# Patient Record
Sex: Female | Born: 1990 | Race: White | Hispanic: No | Marital: Married | Smoking: Never smoker
Health system: Southern US, Community
[De-identification: ages and names within clinical notes are randomized; demographics above are authoritative.]

## PROBLEM LIST (undated history)

## (undated) DIAGNOSIS — F32A Depression, unspecified: Secondary | ICD-10-CM

## (undated) DIAGNOSIS — R112 Nausea with vomiting, unspecified: Secondary | ICD-10-CM

## (undated) DIAGNOSIS — D509 Iron deficiency anemia, unspecified: Secondary | ICD-10-CM

## (undated) DIAGNOSIS — R519 Headache, unspecified: Secondary | ICD-10-CM

## (undated) DIAGNOSIS — E669 Obesity, unspecified: Secondary | ICD-10-CM

## (undated) DIAGNOSIS — E559 Vitamin D deficiency, unspecified: Secondary | ICD-10-CM

## (undated) DIAGNOSIS — M549 Dorsalgia, unspecified: Secondary | ICD-10-CM

## (undated) DIAGNOSIS — L309 Dermatitis, unspecified: Secondary | ICD-10-CM

## (undated) DIAGNOSIS — E611 Iron deficiency: Secondary | ICD-10-CM

## (undated) DIAGNOSIS — Z9889 Other specified postprocedural states: Secondary | ICD-10-CM

## (undated) DIAGNOSIS — F419 Anxiety disorder, unspecified: Secondary | ICD-10-CM

## (undated) DIAGNOSIS — F4310 Post-traumatic stress disorder, unspecified: Secondary | ICD-10-CM

## (undated) DIAGNOSIS — N289 Disorder of kidney and ureter, unspecified: Secondary | ICD-10-CM

## (undated) DIAGNOSIS — K829 Disease of gallbladder, unspecified: Secondary | ICD-10-CM

## (undated) DIAGNOSIS — F988 Other specified behavioral and emotional disorders with onset usually occurring in childhood and adolescence: Secondary | ICD-10-CM

## (undated) DIAGNOSIS — R7303 Prediabetes: Secondary | ICD-10-CM

## (undated) DIAGNOSIS — M7989 Other specified soft tissue disorders: Secondary | ICD-10-CM

## (undated) DIAGNOSIS — Z3490 Encounter for supervision of normal pregnancy, unspecified, unspecified trimester: Secondary | ICD-10-CM

## (undated) DIAGNOSIS — K760 Fatty (change of) liver, not elsewhere classified: Secondary | ICD-10-CM

## (undated) DIAGNOSIS — K589 Irritable bowel syndrome without diarrhea: Secondary | ICD-10-CM

## (undated) DIAGNOSIS — N920 Excessive and frequent menstruation with regular cycle: Secondary | ICD-10-CM

## (undated) HISTORY — PX: WISDOM TOOTH EXTRACTION: SHX21

## (undated) HISTORY — DX: Fatty (change of) liver, not elsewhere classified: K76.0

## (undated) HISTORY — PX: TYMPANOSTOMY TUBE PLACEMENT: SHX32

## (undated) HISTORY — DX: Irritable bowel syndrome, unspecified: K58.9

## (undated) HISTORY — DX: Depression, unspecified: F32.A

## (undated) HISTORY — DX: Dorsalgia, unspecified: M54.9

## (undated) HISTORY — DX: Post-traumatic stress disorder, unspecified: F43.10

## (undated) HISTORY — DX: Anxiety disorder, unspecified: F41.9

## (undated) HISTORY — DX: Other specified soft tissue disorders: M79.89

## (undated) HISTORY — DX: Obesity, unspecified: E66.9

## (undated) HISTORY — DX: Other specified behavioral and emotional disorders with onset usually occurring in childhood and adolescence: F98.8

## (undated) HISTORY — DX: Headache, unspecified: R51.9

## (undated) HISTORY — DX: Disease of gallbladder, unspecified: K82.9

## (undated) HISTORY — DX: Iron deficiency anemia, unspecified: D50.9

## (undated) HISTORY — DX: Vitamin D deficiency, unspecified: E55.9

## (undated) HISTORY — DX: Dermatitis, unspecified: L30.9

## (undated) HISTORY — DX: Disorder of kidney and ureter, unspecified: N28.9

## (undated) HISTORY — PX: ADENOIDECTOMY: SUR15

## (undated) HISTORY — DX: Iron deficiency: E61.1

## (undated) HISTORY — PX: TONSILLECTOMY: SUR1361

## (undated) HISTORY — PX: TUBAL LIGATION: SHX77

## (undated) HISTORY — DX: Excessive and frequent menstruation with regular cycle: N92.0

## (undated) HISTORY — DX: Prediabetes: R73.03

---

## 2015-09-03 ENCOUNTER — Encounter (HOSPITAL_BASED_OUTPATIENT_CLINIC_OR_DEPARTMENT_OTHER): Payer: Self-pay

## 2015-09-03 ENCOUNTER — Emergency Department (HOSPITAL_BASED_OUTPATIENT_CLINIC_OR_DEPARTMENT_OTHER)
Admission: EM | Admit: 2015-09-03 | Discharge: 2015-09-03 | Disposition: A | Discharge status: Home / Self Care | Payer: 59 | Attending: Emergency Medicine | Admitting: Emergency Medicine

## 2015-09-03 DIAGNOSIS — O200 Threatened abortion: Secondary | ICD-10-CM | POA: Diagnosis not present

## 2015-09-03 DIAGNOSIS — Z3A01 Less than 8 weeks gestation of pregnancy: Secondary | ICD-10-CM | POA: Insufficient documentation

## 2015-09-03 DIAGNOSIS — Z79899 Other long term (current) drug therapy: Secondary | ICD-10-CM | POA: Insufficient documentation

## 2015-09-03 DIAGNOSIS — O209 Hemorrhage in early pregnancy, unspecified: Secondary | ICD-10-CM | POA: Diagnosis present

## 2015-09-03 DIAGNOSIS — Z880 Allergy status to penicillin: Secondary | ICD-10-CM | POA: Insufficient documentation

## 2015-09-03 HISTORY — DX: Encounter for supervision of normal pregnancy, unspecified, unspecified trimester: Z34.90

## 2015-09-03 LAB — WET PREP, GENITAL: Trich, Wet Prep: NONE SEEN

## 2015-09-03 LAB — HCG, QUANTITATIVE, PREGNANCY: hCG, Beta Chain, Quant, S: 651 m[IU]/mL — ABNORMAL HIGH (ref <5)

## 2015-09-03 LAB — ABO/RH: ABO/RH(D): O POS

## 2015-09-03 NOTE — ED Provider Notes (Signed)
CSN: [id number]     Arrival date & time 09/03/15  1102 History   First MD Initiated Contact with Patient 09/03/15 1110     Chief Complaint  Patient presents with  . pregnant-bleeding      (Consider location/radiation/quality/duration/timing/severity/associated sxs/prior Treatment) Patient is a 24 y.o. female presenting with abdominal pain.  Abdominal Pain Pain location:  Generalized Pain quality: cramping   Pain radiates to:  Does not radiate Pain severity:  Mild Onset quality:  Gradual Duration:  1 day Timing:  Constant Progression:  Unchanged Chronicity:  New Context comment:  Pregnant, recent intrauterine yolk sac without gestation found within last week Relieved by:  Nothing Worsened by:  Nothing tried Associated symptoms: vaginal bleeding (after sex)   Associated symptoms: no dysuria and no hematuria     Past Medical History  Diagnosis Date  . Pregnant    History reviewed. No pertinent past surgical history. No family history on file. Social History  Substance Use Topics  . Smoking status: Never Smoker   . Smokeless tobacco: None  . Alcohol Use: None   OB History    Gravida Para Term Preterm AB TAB SAB Ectopic Multiple Living   1              Review of Systems  Gastrointestinal: Positive for abdominal pain.  Genitourinary: Positive for vaginal bleeding (after sex). Negative for dysuria and hematuria.  All other systems reviewed and are negative.     Allergies  Penicillins  Home Medications   Prior to Admission medications   Medication Sig Start Date End Date Taking? Authorizing Provider  Prenatal Vit-Fe Fumarate-FA (MULTIVITAMIN-PRENATAL) 27-0.8 MG TABS tablet Take 1 tablet by mouth daily at 12 noon.   Yes Historical Provider, MD   BP 111/63 mmHg  Pulse 89  Temp(Src) 98.1 F (36.7 C) (Oral)  Resp 18  Ht  (1.499 m)  Wt 214 lb (97.07 kg)  BMI 43.20 kg/m2  SpO2 98%  LMP 07/12/2015 Physical Exam  Constitutional: She is oriented to  person, place, and time. She appears well-developed and well-nourished.  HENT:  Head: Normocephalic and atraumatic.  Right Ear: External ear normal.  Left Ear: External ear normal.  Eyes: Conjunctivae and EOM are normal. Pupils are equal, round, and reactive to light.  Neck: Normal range of motion. Neck supple.  Cardiovascular: Normal rate, regular rhythm, normal heart sounds and intact distal pulses.   Pulmonary/Chest: Effort normal and breath sounds normal.  Abdominal: Soft. Bowel sounds are normal. There is generalized tenderness (mild).  Genitourinary: Cervix exhibits discharge (blood). Cervix exhibits no motion tenderness and no friability. Right adnexum displays no tenderness. Left adnexum displays no tenderness. There is bleeding (mild) in the vagina. No vaginal discharge found.  Musculoskeletal: Normal range of motion.  Neurological: She is alert and oriented to person, place, and time.  Skin: Skin is warm and dry.  Vitals reviewed.   ED Course  Procedures (including critical care time) Labs Review Labs Reviewed  WET PREP, GENITAL - Abnormal; Notable for the following:    Yeast Wet Prep HPF POC FEW (*)    Clue Cells Wet Prep HPF POC MANY (*)    WBC, Wet Prep HPF POC MANY (*)    All other components within normal limits  HCG, QUANTITATIVE, PREGNANCY - Abnormal; Notable for the following:    hCG, Beta Chain, Quant, S 651 (*)    All other components within normal limits  ABO/RH  GC/CHLAMYDIA PROBE AMP ([place]) NOT AT [facility]  Imaging Review No results found. I have personally reviewed and evaluated these images and lab results as part of my medical decision-making.   EKG Interpretation None     EMERGENCY DEPARTMENT US PREGNANCY "Study: Limited Ultrasound of the Pelvis for Pregnancy"  INDICATIONS:Pregnancy(required) and Vaginal bleeding Multiple views of the uterus and pelvic cavity were obtained in real-time with a multi-frequency  probe.  APPROACH:Transabdominal   PERFORMED BY: Myself  IMAGES ARCHIVED?: Yes  LIMITATIONS: Body habitus  PREGNANCY FREE FLUID: None  ADNEXAL FINDINGS:none  PREGNANCY FINDINGS: Intrauterine gestational sac noted  INTERPRETATION: Pelvic free fluid absent  GESTATIONAL AGE, ESTIMATE:   FETAL HEART RATE:   CPT Codes:  81191-4776815-26 (transabdominal OB)  768-26-52 (transvaginal OB, Reduced level of service for incomplete exam)     MDM   Final diagnoses:  Threatened abortion    24 y.o. female with pertinent PMH of recent gravid status presents with vaginal bleeding after sex.  Mild crampy abd pain unchanged over recent past. Had recent TV US with indeterminate IU growth.  No increase in abd pain.  Physical exam today as above.  Bedside US similar to prior.  No abd pain.  DC home to fu with OB  I have reviewed all laboratory and imaging studies if ordered as above  1. Threatened abortion         Mirian MoMatthew Brailyn Delman, MD 09/07/15 1251

## 2015-09-03 NOTE — ED Notes (Signed)
Patient reports that she is 6-[redacted] weeks pregnant per ultrasound on 10/31 and yesterday developed mild abdominal cramping and light bleeding. Bleeding continued today, denies clots, denies passing any tissue. G2, P1

## 2015-09-03 NOTE — Discharge Instructions (Signed)
Threatened Miscarriage °A threatened miscarriage occurs when you have vaginal bleeding during your first 20 weeks of pregnancy but the pregnancy has not ended. If you have vaginal bleeding during this time, your health care provider will do tests to make sure you are still pregnant. If the tests show you are still pregnant and the developing baby (fetus) inside your womb (uterus) is still growing, your condition is considered a threatened miscarriage. °A threatened miscarriage does not mean your pregnancy will end, but it does increase the risk of losing your pregnancy (complete miscarriage). °CAUSES  °The cause of a threatened miscarriage is usually not known. If you go on to have a complete miscarriage, the most common cause is an abnormal number of chromosomes in the developing baby. Chromosomes are the structures inside cells that hold all your genetic material. °Some causes of vaginal bleeding that do not result in miscarriage include: °· Having sex. °· Having an infection. °· Normal hormone changes of pregnancy. °· Bleeding that occurs when an egg implants in your uterus. °RISK FACTORS °Risk factors for bleeding in early pregnancy include: °· Obesity. °· Smoking. °· Drinking excessive amounts of alcohol or caffeine. °· Recreational drug use. °SIGNS AND SYMPTOMS °· Light vaginal bleeding. °· Mild abdominal pain or cramps. °DIAGNOSIS  °If you have bleeding with or without abdominal pain before 20 weeks of pregnancy, your health care provider will do tests to check whether you are still pregnant. One important test involves using sound waves and a computer (ultrasound) to create images of the inside of your uterus. Other tests include an internal exam of your vagina and uterus (pelvic exam) and measurement of your baby's heart rate.  °You may be diagnosed with a threatened miscarriage if: °· Ultrasound testing shows you are still pregnant. °· Your baby's heart rate is strong. °· A pelvic exam shows that the  opening between your uterus and your vagina (cervix) is closed. °· Your heart rate and blood pressure are stable. °· Blood tests confirm you are still pregnant. °TREATMENT  °No treatments have been shown to prevent a threatened miscarriage from going on to a complete miscarriage. However, the right home care is important.  °HOME CARE INSTRUCTIONS  °· Make sure you keep all your appointments for prenatal care. This is very important. °· Get plenty of rest. °· Do not have sex or use tampons if you have vaginal bleeding. °· Do not douche. °· Do not smoke or use recreational drugs. °· Do not drink alcohol. °· Avoid caffeine. °SEEK MEDICAL CARE IF: °· You have light vaginal bleeding or spotting while pregnant. °· You have abdominal pain or cramping. °· You have a fever. °SEEK IMMEDIATE MEDICAL CARE IF: °· You have heavy vaginal bleeding. °· You have blood clots coming from your vagina. °· You have severe low back pain or abdominal cramps. °· You have fever, chills, and severe abdominal pain. °MAKE SURE YOU: °· Understand these instructions. °· Will watch your condition. °· Will get help right away if you are not doing well or get worse. °  °This information is not intended to replace advice given to you by your health care provider. Make sure you discuss any questions you have with your health care provider. °  °Document Released: 10/14/2005 Document Revised: 10/19/2013 Document Reviewed: 08/10/2013 °Elsevier Interactive Patient Education ©2016 [employer] ° °Vaginal Bleeding During Pregnancy, First Trimester °A small amount of bleeding (spotting) from the vagina is relatively common in early pregnancy. It usually stops on its own.   Various things may cause bleeding or spotting in early pregnancy. Some bleeding may be related to the pregnancy, and some may not. In most cases, the bleeding is normal and is not a problem. However, bleeding can also be a sign of something serious. Be sure to tell your health care provider  about any vaginal bleeding right away. °Some possible causes of vaginal bleeding during the first trimester include: °· Infection or inflammation of the cervix. °· Growths (polyps) on the cervix. °· Miscarriage or threatened miscarriage. °· Pregnancy tissue has developed outside of the uterus and in a fallopian tube (tubal pregnancy). °· Tiny cysts have developed in the uterus instead of pregnancy tissue (molar pregnancy). °HOME CARE INSTRUCTIONS  °Watch your condition for any changes. The following actions may help to lessen any discomfort you are feeling: °· Follow your health care provider's instructions for limiting your activity. If your health care provider orders bed rest, you may need to stay in bed and only get up to use the bathroom. However, your health care provider may allow you to continue light activity. °· If needed, make plans for someone to help with your regular activities and responsibilities while you are on bed rest. °· Keep track of the number of pads you use each day, how often you change pads, and how soaked (saturated) they are. Write this down. °· Do not use tampons. Do not douche. °· Do not have sexual intercourse or orgasms until approved by your health care provider. °· If you pass any tissue from your vagina, save the tissue so you can show it to your health care provider. °· Only take over-the-counter or prescription medicines as directed by your health care provider. °· Do not take aspirin because it can make you bleed. °· Keep all follow-up appointments as directed by your health care provider. °SEEK MEDICAL CARE IF: °· You have any vaginal bleeding during any part of your pregnancy. °· You have cramps or labor pains. °· You have a fever, not controlled by medicine. °SEEK IMMEDIATE MEDICAL CARE IF:  °· You have severe cramps in your back or belly (abdomen). °· You pass large clots or tissue from your vagina. °· Your bleeding increases. °· You feel light-headed or weak, or you have  fainting episodes. °· You have chills. °· You are leaking fluid or have a gush of fluid from your vagina. °· You pass out while having a bowel movement. °MAKE SURE YOU: °· Understand these instructions. °· Will watch your condition. °· Will get help right away if you are not doing well or get worse. °  °This information is not intended to replace advice given to you by your health care provider. Make sure you discuss any questions you have with your health care provider. °  °Document Released: 07/24/2005 Document Revised: 10/19/2013 Document Reviewed: 06/21/2013 °Elsevier Interactive Patient Education ©2016 Elsevier Inc. ° °

## 2015-09-04 LAB — GC/CHLAMYDIA PROBE AMP (~~LOC~~) NOT AT ARMC
CHLAMYDIA, DNA PROBE: NEGATIVE
Neisseria Gonorrhea: NEGATIVE

## 2016-12-31 DIAGNOSIS — Z98891 History of uterine scar from previous surgery: Secondary | ICD-10-CM | POA: Insufficient documentation

## 2019-11-17 DIAGNOSIS — G44209 Tension-type headache, unspecified, not intractable: Secondary | ICD-10-CM | POA: Diagnosis not present

## 2019-11-30 DIAGNOSIS — Z3202 Encounter for pregnancy test, result negative: Secondary | ICD-10-CM | POA: Diagnosis not present

## 2019-11-30 DIAGNOSIS — Z3201 Encounter for pregnancy test, result positive: Secondary | ICD-10-CM | POA: Diagnosis not present

## 2020-01-18 DIAGNOSIS — K580 Irritable bowel syndrome with diarrhea: Secondary | ICD-10-CM | POA: Diagnosis not present

## 2020-01-18 DIAGNOSIS — D649 Anemia, unspecified: Secondary | ICD-10-CM | POA: Diagnosis not present

## 2020-01-18 DIAGNOSIS — Z0001 Encounter for general adult medical examination with abnormal findings: Secondary | ICD-10-CM | POA: Diagnosis not present

## 2020-02-15 DIAGNOSIS — Z01419 Encounter for gynecological examination (general) (routine) without abnormal findings: Secondary | ICD-10-CM | POA: Diagnosis not present

## 2020-02-15 DIAGNOSIS — Z0000 Encounter for general adult medical examination without abnormal findings: Secondary | ICD-10-CM | POA: Diagnosis not present

## 2020-02-15 DIAGNOSIS — Z130 Encounter for screening for diseases of the blood and blood-forming organs and certain disorders involving the immune mechanism: Secondary | ICD-10-CM | POA: Diagnosis not present

## 2020-02-21 DIAGNOSIS — S93402A Sprain of unspecified ligament of left ankle, initial encounter: Secondary | ICD-10-CM | POA: Diagnosis not present

## 2020-02-21 DIAGNOSIS — E559 Vitamin D deficiency, unspecified: Secondary | ICD-10-CM | POA: Diagnosis not present

## 2020-02-22 DIAGNOSIS — Z30430 Encounter for insertion of intrauterine contraceptive device: Secondary | ICD-10-CM | POA: Diagnosis not present

## 2020-03-10 DIAGNOSIS — S93402A Sprain of unspecified ligament of left ankle, initial encounter: Secondary | ICD-10-CM | POA: Diagnosis not present

## 2020-03-10 DIAGNOSIS — E559 Vitamin D deficiency, unspecified: Secondary | ICD-10-CM | POA: Diagnosis not present

## 2020-03-26 DIAGNOSIS — Z23 Encounter for immunization: Secondary | ICD-10-CM | POA: Diagnosis not present

## 2020-03-29 DIAGNOSIS — Z30431 Encounter for routine checking of intrauterine contraceptive device: Secondary | ICD-10-CM | POA: Diagnosis not present

## 2020-04-17 ENCOUNTER — Other Ambulatory Visit: Payer: Self-pay

## 2020-04-17 ENCOUNTER — Ambulatory Visit
Admission: EM | Admit: 2020-04-17 | Discharge: 2020-04-17 | Disposition: A | Discharge status: Home / Self Care | Payer: Medicaid Other | Attending: Physician Assistant | Admitting: Physician Assistant

## 2020-04-17 DIAGNOSIS — R1012 Left upper quadrant pain: Secondary | ICD-10-CM

## 2020-04-17 DIAGNOSIS — R1011 Right upper quadrant pain: Secondary | ICD-10-CM

## 2020-04-17 MED ORDER — DICYCLOMINE HCL 20 MG PO TABS: 20.0000 mg | ORAL_TABLET | Freq: Two times a day (BID) | ORAL | 0 refills | Status: DC

## 2020-04-17 NOTE — Discharge Instructions (Signed)
No alarming signs on exam. You can take bentyl to see if this slows down bowel movements, and in turn helps with your abdominal pain. Can pick up over the counter pepcid for acid reflux as needed. Keep hydrated, urine should be clear to pale yellow in color. Avoid fatty/spicy foods for now. Follow up with PCP as scheduled tomorrow for further evaluation needed. If significant worsening of symptoms, nausea/vomiting, fever, go to the ED for further evaluation needed.

## 2020-04-17 NOTE — ED Provider Notes (Signed)
[facility]    CSN: [id number] Arrival date & time: 04/17/20  1357      History   Chief Complaint Chief Complaint  Patient presents with  . Abdominal Pain    HPI Chasidy Janak is a 29 y.o. female.   29 year old female comes in for 3-4 day history of bilateral upper abdominal pain. Had severe episode 3 days ago with cramping pain, radiating to the back. Now with bloating sensation/dull pain, worse before BM. Possible some worsening after oral intake. Denies nausea/vomiting. Frequent stools, soft, no watery diarrhea. Denies fever, URI symptoms, urinary symptoms. S/p C-section x 3.      Past Medical History:  Diagnosis Date  . Pregnant     There are no problems to display for this patient.   History reviewed. No pertinent surgical history.  OB History    Gravida  1   Para      Term      Preterm      AB      Living        SAB      TAB      Ectopic      Multiple      Live Births               Home Medications    Prior to Admission medications   Medication Sig Start Date End Date Taking? Authorizing Provider  dicyclomine (BENTYL) 20 MG tablet Take 1 tablet (20 mg total) by mouth 2 (two) times daily. 04/17/20   Ok Edwards, PA-C    Family History History reviewed. No pertinent family history.  Social History Social History   Tobacco Use  . Smoking status: Never Smoker  . Smokeless tobacco: Never Used  Substance Use Topics  . Alcohol use: Not Currently  . Drug use: Never     Allergies   Penicillins   Review of Systems Review of Systems  Reason unable to perform ROS: See HPI as above.     Physical Exam Triage Vital Signs ED Triage Vitals  Enc Vitals Group     BP 04/17/20 1419 121/87     Pulse Rate 04/17/20 1419 88     Resp 04/17/20 1419 18     Temp 04/17/20 1419 99.2 F (37.3 C)     Temp Source 04/17/20 1419 Oral     SpO2 04/17/20 1419 95 %     Weight --      Height --      Head Circumference --      Peak Flow  --      Pain Score 04/17/20 1420 5     Pain Loc --      Pain Edu? --      Excl. in [place]? --    No data found.  Updated Vital Signs BP 121/87 (BP Location: Left Arm)   Pulse 88   Temp 99.2 F (37.3 C) (Oral)   Resp 18   SpO2 95%   Breastfeeding No   Physical Exam Constitutional:      General: She is not in acute distress.    Appearance: She is well-developed. She is not ill-appearing, toxic-appearing or diaphoretic.  HENT:     Head: Normocephalic and atraumatic.  Eyes:     Conjunctiva/sclera: Conjunctivae normal.     Pupils: Pupils are equal, round, and reactive to light.  Cardiovascular:     Rate and Rhythm: Normal rate and regular rhythm.  Pulmonary:  Effort: Pulmonary effort is normal. No respiratory distress.     Comments: LCTAB Abdominal:     General: Bowel sounds are normal.     Palpations: Abdomen is soft.     Tenderness: There is abdominal tenderness in the right upper quadrant, epigastric area and left upper quadrant. There is no right CVA tenderness, left CVA tenderness, guarding or rebound. Negative signs include Murphy's sign.  Musculoskeletal:     Cervical back: Normal range of motion and neck supple.  Skin:    General: Skin is warm and dry.  Neurological:     Mental Status: She is alert and oriented to person, place, and time.  Psychiatric:        Behavior: Behavior normal.        Judgment: Judgment normal.      UC Treatments / Results  Labs (all labs ordered are listed, but only abnormal results are displayed) Labs Reviewed - No data to display  EKG   Radiology No results found.  Procedures Procedures (including critical care time)  Medications Ordered in UC Medications - No data to display  Initial Impression / Assessment and Plan / UC Course  I have reviewed the triage vital signs and the nursing notes.  Pertinent labs & imaging results that were available during my care of the patient were reviewed by me and considered in my  medical decision making (see chart for details).    No alarming signs on exam. Mild tenderness without guarding or rebound.  Due to symptoms worsening before bowel movements, more frequent stools, can try Bentyl to see if symptoms improve.  Patient also endorses acid reflux-like symptoms, can try Pepcid for symptomatic relief.  Push fluids.  Patient already with PCP appointment tomorrow, to follow-up for further evaluation and management needed.  Return precautions given.  Patient expresses understanding and agrees to plan.  Final Clinical Impressions(s) / UC Diagnoses   Final diagnoses:  Bilateral upper abdominal pain    ED Prescriptions    Medication Sig Dispense Auth. Provider   dicyclomine (BENTYL) 20 MG tablet Take 1 tablet (20 mg total) by mouth 2 (two) times daily. 10 tablet Belinda Fisher, PA-C     PDMP not reviewed this encounter.   Belinda Fisher, PA-C 04/17/20 1528

## 2020-04-17 NOTE — ED Triage Notes (Signed)
Pt c/o upper abdominal pain since Saturday with center back pain the first day only. Denies n/v/d. States has had more stools then normal.

## 2020-04-18 DIAGNOSIS — D509 Iron deficiency anemia, unspecified: Secondary | ICD-10-CM | POA: Diagnosis not present

## 2020-04-18 DIAGNOSIS — K8020 Calculus of gallbladder without cholecystitis without obstruction: Secondary | ICD-10-CM | POA: Diagnosis not present

## 2020-04-18 DIAGNOSIS — R109 Unspecified abdominal pain: Secondary | ICD-10-CM | POA: Diagnosis not present

## 2020-04-23 DIAGNOSIS — Z23 Encounter for immunization: Secondary | ICD-10-CM | POA: Diagnosis not present

## 2020-04-27 DIAGNOSIS — Z419 Encounter for procedure for purposes other than remedying health state, unspecified: Secondary | ICD-10-CM | POA: Diagnosis not present

## 2020-04-28 DIAGNOSIS — K760 Fatty (change of) liver, not elsewhere classified: Secondary | ICD-10-CM | POA: Diagnosis not present

## 2020-04-28 DIAGNOSIS — K8020 Calculus of gallbladder without cholecystitis without obstruction: Secondary | ICD-10-CM | POA: Diagnosis not present

## 2020-05-03 DIAGNOSIS — K8020 Calculus of gallbladder without cholecystitis without obstruction: Secondary | ICD-10-CM | POA: Diagnosis not present

## 2020-05-03 DIAGNOSIS — D509 Iron deficiency anemia, unspecified: Secondary | ICD-10-CM | POA: Diagnosis not present

## 2020-05-24 ENCOUNTER — Ambulatory Visit: Payer: Self-pay | Admitting: General Surgery

## 2020-05-24 DIAGNOSIS — K8020 Calculus of gallbladder without cholecystitis without obstruction: Secondary | ICD-10-CM | POA: Diagnosis not present

## 2020-05-28 DIAGNOSIS — Z419 Encounter for procedure for purposes other than remedying health state, unspecified: Secondary | ICD-10-CM | POA: Diagnosis not present

## 2020-06-28 DIAGNOSIS — Z419 Encounter for procedure for purposes other than remedying health state, unspecified: Secondary | ICD-10-CM | POA: Diagnosis not present

## 2020-06-29 ENCOUNTER — Encounter (HOSPITAL_COMMUNITY)
Admission: RE | Admit: 2020-06-29 | Discharge: 2020-06-29 | Disposition: A | Discharge status: Home / Self Care | Payer: Medicaid Other | Source: Ambulatory Visit | Attending: General Surgery | Admitting: General Surgery

## 2020-06-29 ENCOUNTER — Encounter (HOSPITAL_COMMUNITY): Payer: Self-pay

## 2020-06-29 ENCOUNTER — Other Ambulatory Visit: Payer: Self-pay

## 2020-06-29 DIAGNOSIS — Z01812 Encounter for preprocedural laboratory examination: Secondary | ICD-10-CM | POA: Insufficient documentation

## 2020-06-29 HISTORY — DX: Other specified postprocedural states: Z98.890

## 2020-06-29 HISTORY — DX: Nausea with vomiting, unspecified: R11.2

## 2020-06-29 LAB — CBC WITH DIFFERENTIAL/PLATELET
Abs Immature Granulocytes: 0.07 10*3/uL (ref 0.00–0.07)
Basophils Absolute: 0 10*3/uL (ref 0.0–0.1)
Basophils Relative: 0 %
Eosinophils Absolute: 0.3 10*3/uL (ref 0.0–0.5)
Eosinophils Relative: 2 %
HCT: 38.3 % (ref 36.0–46.0)
Hemoglobin: 11.8 g/dL — ABNORMAL LOW (ref 12.0–15.0)
Immature Granulocytes: 1 %
Lymphocytes Relative: 25 %
Lymphs Abs: 2.8 10*3/uL (ref 0.7–4.0)
MCH: 24.2 pg — ABNORMAL LOW (ref 26.0–34.0)
MCHC: 30.8 g/dL (ref 30.0–36.0)
MCV: 78.6 fL — ABNORMAL LOW (ref 80.0–100.0)
Monocytes Absolute: 0.6 10*3/uL (ref 0.1–1.0)
Monocytes Relative: 5 %
Neutro Abs: 7.6 10*3/uL (ref 1.7–7.7)
Neutrophils Relative %: 67 %
Platelets: 297 10*3/uL (ref 150–400)
RBC: 4.87 MIL/uL (ref 3.87–5.11)
RDW: 16.7 % — ABNORMAL HIGH (ref 11.5–15.5)
WBC: 11.4 10*3/uL — ABNORMAL HIGH (ref 4.0–10.5)
nRBC: 0 % (ref 0.0–0.2)

## 2020-06-29 LAB — COMPREHENSIVE METABOLIC PANEL
ALT: 18 U/L (ref 0–44)
AST: 19 U/L (ref 15–41)
Albumin: 3.4 g/dL — ABNORMAL LOW (ref 3.5–5.0)
Alkaline Phosphatase: 79 U/L (ref 38–126)
Anion gap: 10 (ref 5–15)
BUN: 9 mg/dL (ref 6–20)
CO2: 24 mmol/L (ref 22–32)
Calcium: 8.9 mg/dL (ref 8.9–10.3)
Chloride: 104 mmol/L (ref 98–111)
Creatinine, Ser: 0.48 mg/dL (ref 0.44–1.00)
GFR calc Af Amer: >60 mL/min (ref >60)
GFR calc non Af Amer: >60 mL/min (ref >60)
Glucose, Bld: 89 mg/dL (ref 70–99)
Potassium: 4.1 mmol/L (ref 3.5–5.1)
Sodium: 138 mmol/L (ref 135–145)
Total Bilirubin: 0.7 mg/dL (ref 0.3–1.2)
Total Protein: 7.5 g/dL (ref 6.5–8.1)

## 2020-06-29 LAB — LIPASE, BLOOD: Lipase: 26 U/L (ref 11–51)

## 2020-06-29 NOTE — Progress Notes (Signed)
PCP - Dr. Richardean Chimera in [facility] Cardiologist - Denies  Chest x-ray - Not indicated EKG - Not indicated Stress Test - Denies ECHO - Denies Cardiac Cath - Denies  Sleep Study - denies  DM- Denies  ERAS Protcol -yes instructions given  COVID TEST- 07/04/20  Anesthesia review: No  Patient denies shortness of breath, fever, cough and chest pain at PAT appointment   All instructions explained to the patient, with a verbal understanding of the material. Patient agrees to go over the instructions while at home for a better understanding. Patient also instructed to self quarantine after being tested for COVID-19. The opportunity to ask questions was provided.

## 2020-06-29 NOTE — Pre-Procedure Instructions (Signed)
[id number] [place], [place] - [address] [place] [address] Phone: [phone] Fax: [phone]      Your procedure is scheduled on Friday 07/07/20.  Report to [facility] Main Entrance "A" at 7:30 A.M., and check in at the Admitting office.  Call this number if you have problems the morning of surgery:  [phone]  Call [phone] if you have any questions prior to your surgery date Monday-Friday 8am-4pm    Remember:  Do not eat after midnight the night before your surgery  You may drink clear liquids until 6:30am the morning of your surgery.   Clear liquids allowed are: Water, Non-Citrus Juices (without pulp), Carbonated Beverages, Clear Tea, Black Coffee Only, and Gatorade    Take these medicines the morning of surgery with A SIP OF WATER   NONE  As of today, STOP taking any Aspirin (unless otherwise instructed by your surgeon) Aleve, Naproxen, Ibuprofen, Motrin, Advil, Goody's, BC's, all herbal medications, fish oil, and all vitamins.                      Do not wear jewelry, make up, or nail polish            Do not wear lotions, powders, perfumes, or deodorant.            Do not shave 48 hours prior to surgery.              Do not bring valuables to the hospital.            [facility] is not responsible for any belongings or valuables.  Do NOT Smoke (Tobacco/Vaping) or drink Alcohol 24 hours prior to your procedure If you use a CPAP at night, you may bring all equipment for your overnight stay.   Contacts, glasses, dentures or bridgework may not be worn into surgery.      For patients admitted to the hospital, discharge time will be determined by your treatment team.   Patients discharged the day of surgery will not be allowed to drive home, and someone needs to stay with them for 24 hours.    Special instructions:   [facility]- Preparing For Surgery  Before surgery, you can play an important role. Because skin is not  sterile, your skin needs to be as free of germs as possible. You can reduce the number of germs on your skin by washing with CHG (chlorahexidine gluconate) Soap before surgery.  CHG is an antiseptic cleaner which kills germs and bonds with the skin to continue killing germs even after washing.    Oral Hygiene is also important to reduce your risk of infection.  Remember - BRUSH YOUR TEETH THE MORNING OF SURGERY WITH YOUR REGULAR TOOTHPASTE  Please do not use if you have an allergy to CHG or antibacterial soaps. If your skin becomes reddened/irritated stop using the CHG.  Do not shave (including legs and underarms) for at least 48 hours prior to first CHG shower. It is OK to shave your face.  Please follow these instructions carefully.   1. Shower the NIGHT BEFORE SURGERY and the MORNING OF SURGERY with CHG Soap.   2. If you chose to wash your hair, wash your hair first as usual with your normal shampoo.  3. After you shampoo, rinse your hair and body thoroughly to remove the shampoo.  4. Use CHG as you would any other liquid soap. You can apply CHG directly to the  skin and wash gently with a scrungie or a clean washcloth.   5. Apply the CHG Soap to your body ONLY FROM THE NECK DOWN.  Do not use on open wounds or open sores. Avoid contact with your eyes, ears, mouth and genitals (private parts). Wash Face and genitals (private parts)  with your normal soap.   6. Wash thoroughly, paying special attention to the area where your surgery will be performed.  7. Thoroughly rinse your body with warm water from the neck down.  8. DO NOT shower/wash with your normal soap after using and rinsing off the CHG Soap.  9. Pat yourself dry with a CLEAN TOWEL.  10. Wear CLEAN PAJAMAS to bed the night before surgery  11. Place CLEAN SHEETS on your bed the night of your first shower and DO NOT SLEEP WITH PETS.   Day of Surgery: Wear Clean/Comfortable clothing the morning of surgery Do not apply any  deodorants/lotions.   Remember to brush your teeth WITH YOUR REGULAR TOOTHPASTE.   Please read over the following fact sheets that you were given.

## 2020-07-04 ENCOUNTER — Other Ambulatory Visit (HOSPITAL_COMMUNITY)
Admission: RE | Admit: 2020-07-04 | Discharge: 2020-07-04 | Disposition: A | Discharge status: Home / Self Care | Payer: Medicaid Other | Source: Ambulatory Visit | Attending: General Surgery | Admitting: General Surgery

## 2020-07-04 DIAGNOSIS — Z20822 Contact with and (suspected) exposure to covid-19: Secondary | ICD-10-CM | POA: Diagnosis not present

## 2020-07-04 DIAGNOSIS — Z01812 Encounter for preprocedural laboratory examination: Secondary | ICD-10-CM | POA: Diagnosis not present

## 2020-07-05 LAB — SARS CORONAVIRUS 2 (TAT 6-24 HRS): SARS Coronavirus 2: NEGATIVE

## 2020-07-07 ENCOUNTER — Encounter (HOSPITAL_COMMUNITY): Payer: Self-pay | Admitting: General Surgery

## 2020-07-07 ENCOUNTER — Encounter (HOSPITAL_COMMUNITY)
Admission: RE | Disposition: A | Discharge status: Home / Self Care | Payer: Self-pay | Source: Home / Self Care | Attending: General Surgery

## 2020-07-07 ENCOUNTER — Other Ambulatory Visit: Payer: Self-pay

## 2020-07-07 ENCOUNTER — Ambulatory Visit (HOSPITAL_COMMUNITY): Payer: Medicaid Other | Admitting: Anesthesiology

## 2020-07-07 ENCOUNTER — Ambulatory Visit (HOSPITAL_COMMUNITY)
Admission: RE | Admit: 2020-07-07 | Discharge: 2020-07-07 | Disposition: A | Discharge status: Home / Self Care | Payer: Medicaid Other | Attending: General Surgery | Admitting: General Surgery

## 2020-07-07 ENCOUNTER — Ambulatory Visit (HOSPITAL_COMMUNITY): Payer: Medicaid Other

## 2020-07-07 DIAGNOSIS — K8010 Calculus of gallbladder with chronic cholecystitis without obstruction: Secondary | ICD-10-CM | POA: Insufficient documentation

## 2020-07-07 DIAGNOSIS — Z9049 Acquired absence of other specified parts of digestive tract: Secondary | ICD-10-CM | POA: Diagnosis not present

## 2020-07-07 DIAGNOSIS — Z419 Encounter for procedure for purposes other than remedying health state, unspecified: Secondary | ICD-10-CM

## 2020-07-07 DIAGNOSIS — Z79899 Other long term (current) drug therapy: Secondary | ICD-10-CM | POA: Diagnosis not present

## 2020-07-07 DIAGNOSIS — K8020 Calculus of gallbladder without cholecystitis without obstruction: Secondary | ICD-10-CM | POA: Diagnosis not present

## 2020-07-07 DIAGNOSIS — Z880 Allergy status to penicillin: Secondary | ICD-10-CM | POA: Insufficient documentation

## 2020-07-07 HISTORY — PX: CHOLECYSTECTOMY: SHX55

## 2020-07-07 SURGERY — LAPAROSCOPIC CHOLECYSTECTOMY WITH INTRAOPERATIVE CHOLANGIOGRAM
Anesthesia: General | Site: Abdomen

## 2020-07-07 MED ORDER — BUPIVACAINE-EPINEPHRINE 0.25% -1:200000 IJ SOLN
INTRAMUSCULAR | Status: DC | PRN
  Administered 2020-07-07: 15 mL

## 2020-07-07 MED ORDER — HYDROCODONE-ACETAMINOPHEN 5-325 MG PO TABS: 1.0000 | ORAL_TABLET | Freq: Four times a day (QID) | ORAL | 0 refills | Status: DC | PRN

## 2020-07-07 MED ORDER — ACETAMINOPHEN 500 MG PO TABS: 1000.0000 mg | ORAL_TABLET | Freq: Once | ORAL | Status: DC

## 2020-07-07 MED ORDER — SUGAMMADEX SODIUM 200 MG/2ML IV SOLN
INTRAVENOUS | Status: DC | PRN
  Administered 2020-07-07: 300 mg via INTRAVENOUS

## 2020-07-07 MED ORDER — CHLORHEXIDINE GLUCONATE CLOTH 2 % EX PADS: 6.0000 | MEDICATED_PAD | Freq: Once | CUTANEOUS | Status: DC

## 2020-07-07 MED ORDER — ROCURONIUM BROMIDE 10 MG/ML (PF) SYRINGE
PREFILLED_SYRINGE | INTRAVENOUS | Status: AC
  Filled 2020-07-07: qty 10

## 2020-07-07 MED ORDER — PROMETHAZINE HCL 25 MG/ML IJ SOLN: 6.2500 mg | INTRAMUSCULAR | Status: DC | PRN

## 2020-07-07 MED ORDER — OXYCODONE HCL 5 MG PO TABS: 5.0000 mg | ORAL_TABLET | Freq: Once | ORAL | Status: DC | PRN

## 2020-07-07 MED ORDER — SCOPOLAMINE 1 MG/3DAYS TD PT72
1.0000 | MEDICATED_PATCH | TRANSDERMAL | Status: DC
  Administered 2020-07-07: 1.5 mg via TRANSDERMAL
  Filled 2020-07-07: qty 1

## 2020-07-07 MED ORDER — ONDANSETRON HCL 4 MG/2ML IJ SOLN
INTRAMUSCULAR | Status: DC | PRN
  Administered 2020-07-07: 4 mg via INTRAVENOUS

## 2020-07-07 MED ORDER — KETOROLAC TROMETHAMINE 30 MG/ML IJ SOLN
INTRAMUSCULAR | Status: AC
  Filled 2020-07-07: qty 1

## 2020-07-07 MED ORDER — CELECOXIB 200 MG PO CAPS
200.0000 mg | ORAL_CAPSULE | ORAL | Status: AC
  Administered 2020-07-07: 200 mg via ORAL
  Filled 2020-07-07: qty 1

## 2020-07-07 MED ORDER — HYDROMORPHONE HCL 1 MG/ML IJ SOLN
INTRAMUSCULAR | Status: AC
  Filled 2020-07-07: qty 1

## 2020-07-07 MED ORDER — MIDAZOLAM HCL 2 MG/2ML IJ SOLN
INTRAMUSCULAR | Status: AC
  Filled 2020-07-07: qty 2

## 2020-07-07 MED ORDER — HEMOSTATIC AGENTS (NO CHARGE) OPTIME
TOPICAL | Status: DC | PRN
  Administered 2020-07-07: 1 via TOPICAL

## 2020-07-07 MED ORDER — DEXAMETHASONE SODIUM PHOSPHATE 10 MG/ML IJ SOLN
INTRAMUSCULAR | Status: AC
  Filled 2020-07-07: qty 1

## 2020-07-07 MED ORDER — OXYCODONE HCL 5 MG/5ML PO SOLN: 5.0000 mg | Freq: Once | ORAL | Status: DC | PRN

## 2020-07-07 MED ORDER — LIDOCAINE 2% (20 MG/ML) 5 ML SYRINGE
INTRAMUSCULAR | Status: DC | PRN
  Administered 2020-07-07: 60 mg via INTRAVENOUS

## 2020-07-07 MED ORDER — DEXAMETHASONE SODIUM PHOSPHATE 10 MG/ML IJ SOLN
INTRAMUSCULAR | Status: DC | PRN
  Administered 2020-07-07: 10 mg via INTRAVENOUS

## 2020-07-07 MED ORDER — 0.9 % SODIUM CHLORIDE (POUR BTL) OPTIME
TOPICAL | Status: DC | PRN
  Administered 2020-07-07: 1000 mL

## 2020-07-07 MED ORDER — ACETAMINOPHEN 500 MG PO TABS
1000.0000 mg | ORAL_TABLET | ORAL | Status: AC
  Administered 2020-07-07: 1000 mg via ORAL
  Filled 2020-07-07: qty 2

## 2020-07-07 MED ORDER — PROPOFOL 10 MG/ML IV BOLUS
INTRAVENOUS | Status: DC | PRN
  Administered 2020-07-07: 200 mg via INTRAVENOUS

## 2020-07-07 MED ORDER — LIDOCAINE 2% (20 MG/ML) 5 ML SYRINGE
INTRAMUSCULAR | Status: AC
  Filled 2020-07-07: qty 5

## 2020-07-07 MED ORDER — SODIUM CHLORIDE 0.9 % IV SOLN
INTRAVENOUS | Status: DC | PRN
  Administered 2020-07-07: 5 mL

## 2020-07-07 MED ORDER — MEPERIDINE HCL 25 MG/ML IJ SOLN: 6.2500 mg | INTRAMUSCULAR | Status: DC | PRN

## 2020-07-07 MED ORDER — ONDANSETRON HCL 4 MG/2ML IJ SOLN
INTRAMUSCULAR | Status: AC
  Filled 2020-07-07: qty 2

## 2020-07-07 MED ORDER — LACTATED RINGERS IV SOLN: INTRAVENOUS | Status: DC

## 2020-07-07 MED ORDER — PHENYLEPHRINE HCL-NACL 10-0.9 MG/250ML-% IV SOLN
INTRAVENOUS | Status: AC
  Filled 2020-07-07: qty 250

## 2020-07-07 MED ORDER — ROCURONIUM BROMIDE 10 MG/ML (PF) SYRINGE
PREFILLED_SYRINGE | INTRAVENOUS | Status: DC | PRN
  Administered 2020-07-07: 60 mg via INTRAVENOUS

## 2020-07-07 MED ORDER — HYDROMORPHONE HCL 1 MG/ML IJ SOLN
0.2500 mg | INTRAMUSCULAR | Status: DC | PRN
  Administered 2020-07-07 (×3): 0.5 mg via INTRAVENOUS

## 2020-07-07 MED ORDER — PROPOFOL 10 MG/ML IV BOLUS
INTRAVENOUS | Status: AC
  Filled 2020-07-07: qty 20

## 2020-07-07 MED ORDER — GABAPENTIN 300 MG PO CAPS
300.0000 mg | ORAL_CAPSULE | ORAL | Status: AC
  Administered 2020-07-07: 300 mg via ORAL
  Filled 2020-07-07: qty 1

## 2020-07-07 MED ORDER — SODIUM CHLORIDE 0.9 % IR SOLN
Status: DC | PRN
  Administered 2020-07-07: 1000 mL

## 2020-07-07 MED ORDER — BUPIVACAINE-EPINEPHRINE (PF) 0.25% -1:200000 IJ SOLN
INTRAMUSCULAR | Status: AC
  Filled 2020-07-07: qty 30

## 2020-07-07 MED ORDER — FENTANYL CITRATE (PF) 250 MCG/5ML IJ SOLN
INTRAMUSCULAR | Status: AC
  Filled 2020-07-07: qty 5

## 2020-07-07 MED ORDER — KETOROLAC TROMETHAMINE 30 MG/ML IJ SOLN
30.0000 mg | Freq: Once | INTRAMUSCULAR | Status: AC | PRN
  Administered 2020-07-07: 30 mg via INTRAVENOUS

## 2020-07-07 MED ORDER — MIDAZOLAM HCL 2 MG/2ML IJ SOLN
INTRAMUSCULAR | Status: DC | PRN
  Administered 2020-07-07: 2 mg via INTRAVENOUS

## 2020-07-07 MED ORDER — FENTANYL CITRATE (PF) 250 MCG/5ML IJ SOLN
INTRAMUSCULAR | Status: DC | PRN
Start: 2020-07-07 — End: 2020-07-07
  Administered 2020-07-07: 100 ug via INTRAVENOUS
  Administered 2020-07-07 (×3): 50 ug via INTRAVENOUS

## 2020-07-07 MED ORDER — CIPROFLOXACIN IN D5W 400 MG/200ML IV SOLN
400.0000 mg | INTRAVENOUS | Status: AC
  Administered 2020-07-07: 400 mg via INTRAVENOUS
  Filled 2020-07-07: qty 200

## 2020-07-07 MED ORDER — ORAL CARE MOUTH RINSE: 15.0000 mL | Freq: Once | OROMUCOSAL | Status: AC

## 2020-07-07 MED ORDER — CHLORHEXIDINE GLUCONATE 0.12 % MT SOLN
15.0000 mL | Freq: Once | OROMUCOSAL | Status: AC
  Administered 2020-07-07: 15 mL via OROMUCOSAL
  Filled 2020-07-07: qty 15

## 2020-07-07 SURGICAL SUPPLY — 43 items
ADH SKN CLS APL DERMABOND .7 (GAUZE/BANDAGES/DRESSINGS) ×1
ADH SKN CLS LQ APL DERMABOND (GAUZE/BANDAGES/DRESSINGS) ×1
APL PRP STRL LF DISP 70% ISPRP (MISCELLANEOUS) ×1
APPLIER CLIP 5 13 M/L LIGAMAX5 (MISCELLANEOUS) ×3
APR CLP MED LRG 5 ANG JAW (MISCELLANEOUS) ×1
BAG SPEC RTRVL LRG 6X4 10 (ENDOMECHANICALS) ×1
BLADE CLIPPER SURG (BLADE) IMPLANT
CANISTER SUCT 3000ML PPV (MISCELLANEOUS) ×3 IMPLANT
CATH REDDICK CHOLANGI 4FR 50CM (CATHETERS) ×3 IMPLANT
CHLORAPREP W/TINT 26 (MISCELLANEOUS) ×3 IMPLANT
CLIP APPLIE 5 13 M/L LIGAMAX5 (MISCELLANEOUS) ×1 IMPLANT
COVER MAYO STAND STRL (DRAPES) ×3 IMPLANT
COVER SURGICAL LIGHT HANDLE (MISCELLANEOUS) ×3 IMPLANT
COVER WAND RF STERILE (DRAPES) ×3 IMPLANT
DERMABOND ADHESIVE PROPEN (GAUZE/BANDAGES/DRESSINGS) ×2
DERMABOND ADVANCED (GAUZE/BANDAGES/DRESSINGS) ×2
DERMABOND ADVANCED .7 DNX12 (GAUZE/BANDAGES/DRESSINGS) ×1 IMPLANT
DERMABOND ADVANCED .7 DNX6 (GAUZE/BANDAGES/DRESSINGS) IMPLANT
DRAPE C-ARM 42X120 X-RAY (DRAPES) ×3 IMPLANT
ELECT REM PT RETURN 9FT ADLT (ELECTROSURGICAL) ×3
ELECTRODE REM PT RTRN 9FT ADLT (ELECTROSURGICAL) ×1 IMPLANT
GLOVE BIO SURGEON STRL SZ7.5 (GLOVE) ×3 IMPLANT
GOWN STRL REUS W/ TWL LRG LVL3 (GOWN DISPOSABLE) ×3 IMPLANT
GOWN STRL REUS W/TWL LRG LVL3 (GOWN DISPOSABLE) ×9
HEMOSTAT SNOW SURGICEL 2X4 (HEMOSTASIS) ×2 IMPLANT
IV CATH 14GX2 1/4 (CATHETERS) ×3 IMPLANT
KIT BASIN OR (CUSTOM PROCEDURE TRAY) ×3 IMPLANT
KIT TURNOVER KIT B (KITS) ×3 IMPLANT
NS IRRIG 1000ML POUR BTL (IV SOLUTION) ×3 IMPLANT
PAD ARMBOARD 7.5X6 YLW CONV (MISCELLANEOUS) ×3 IMPLANT
POUCH SPECIMEN RETRIEVAL 10MM (ENDOMECHANICALS) ×3 IMPLANT
SCISSORS LAP 5X35 DISP (ENDOMECHANICALS) ×3 IMPLANT
SET IRRIG TUBING LAPAROSCOPIC (IRRIGATION / IRRIGATOR) ×3 IMPLANT
SET TUBE SMOKE EVAC HIGH FLOW (TUBING) ×3 IMPLANT
SLEEVE ENDOPATH XCEL 5M (ENDOMECHANICALS) ×6 IMPLANT
SPECIMEN JAR SMALL (MISCELLANEOUS) ×3 IMPLANT
SUT MNCRL AB 4-0 PS2 18 (SUTURE) ×3 IMPLANT
TOWEL GREEN STERILE (TOWEL DISPOSABLE) ×3 IMPLANT
TOWEL GREEN STERILE FF (TOWEL DISPOSABLE) ×3 IMPLANT
TRAY LAPAROSCOPIC MC (CUSTOM PROCEDURE TRAY) ×3 IMPLANT
TROCAR XCEL BLUNT TIP 100MML (ENDOMECHANICALS) ×3 IMPLANT
TROCAR XCEL NON-BLD 5MMX100MML (ENDOMECHANICALS) ×3 IMPLANT
WATER STERILE IRR 1000ML POUR (IV SOLUTION) ×3 IMPLANT

## 2020-07-07 NOTE — Anesthesia Procedure Notes (Addendum)
Procedure Name: Intubation Date/Time: 07/07/2020 10:40 AM Performed by: Shary Decamp, CRNA Pre-anesthesia Checklist: Patient identified, Emergency Drugs available, Suction available and Patient being monitored Patient Re-evaluated:Patient Re-evaluated prior to induction Oxygen Delivery Method: Circle system utilized Preoxygenation: Pre-oxygenation with 100% oxygen Induction Type: IV induction Ventilation: Mask ventilation without difficulty Laryngoscope Size: Miller and 2 Grade View: Grade I Tube type: Oral Tube size: 7.0 mm Number of attempts: 1 Airway Equipment and Method: Stylet and Oral airway Placement Confirmation: ETT inserted through vocal cords under direct vision,  positive ETCO2 and breath sounds checked- equal and bilateral Secured at: 20 cm Tube secured with: Tape Dental Injury: Teeth and Oropharynx as per pre-operative assessment

## 2020-07-07 NOTE — Progress Notes (Signed)
Dilaudid .5mg  iv wasted in stericycle, witnessed by RN.

## 2020-07-07 NOTE — Interval H&P Note (Signed)
History and Physical Interval Note:  07/07/2020 9:12 AM  Christina Santos  has presented today for surgery, with the diagnosis of GALLSTONES.  The various methods of treatment have been discussed with the patient and family. After consideration of risks, benefits and other options for treatment, the patient has consented to  Procedure(s): LAPAROSCOPIC CHOLECYSTECTOMY WITH INTRAOPERATIVE CHOLANGIOGRAM (N/A) as a surgical intervention.  The patient's history has been reviewed, patient examined, no change in status, stable for surgery.  I have reviewed the patient's chart and labs.  Questions were answered to the patient's satisfaction.     [name]

## 2020-07-07 NOTE — Transfer of Care (Signed)
Immediate Anesthesia Transfer of Care Note  Patient: Christina Santos  Procedure(s) Performed: LAPAROSCOPIC CHOLECYSTECTOMY WITH INTRAOPERATIVE CHOLANGIOGRAM (N/A Abdomen)  Patient Location: PACU  Anesthesia Type:General  Level of Consciousness: drowsy, patient cooperative and responds to stimulation  Airway & Oxygen Therapy: Patient Spontanous Breathing and Patient connected to nasal cannula oxygen  Post-op Assessment: Report given to RN, Post -op Vital signs reviewed and stable and Patient moving all extremities X 4  Post vital signs: Reviewed and stable  Last Vitals:  Vitals Value Taken Time  BP    Temp    Pulse 90 07/07/20 1205  Resp 18 07/07/20 1205  SpO2 97 % 07/07/20 1205  Vitals shown include unvalidated device data.  Last Pain:  Vitals:   07/07/20 0805  TempSrc:   PainSc: 0-No pain         Complications: No complications documented.

## 2020-07-07 NOTE — Anesthesia Preprocedure Evaluation (Addendum)
Anesthesia Evaluation  Patient identified by MRN, date of birth, ID band Patient awake    Reviewed: Allergy & Precautions, NPO status , Patient's Chart, lab work & pertinent test results  History of Anesthesia Complications Negative for: history of anesthetic complications  Airway Mallampati: II  TM Distance: >3 FB Neck ROM: Full    Dental no notable dental hx. (+) Teeth Intact, Dental Advisory Given,    Pulmonary neg pulmonary ROS,    Pulmonary exam normal breath sounds clear to auscultation       Cardiovascular negative cardio ROS Normal cardiovascular exam Rhythm:Regular Rate:Normal     Neuro/Psych negative neurological ROS  negative psych ROS   GI/Hepatic Neg liver ROS, Gallstones   Endo/Other  Morbid obesityBMI 47  Renal/GU negative Renal ROS  negative genitourinary   Musculoskeletal negative musculoskeletal ROS (+)   Abdominal (+) + obese,   Peds negative pediatric ROS (+)  Hematology negative hematology ROS (+)   Anesthesia Other Findings   Reproductive/Obstetrics negative OB ROS 3 prior c sections                            Anesthesia Physical Anesthesia Plan  ASA: III  Anesthesia Plan: General   Post-op Pain Management:    Induction: Intravenous  PONV Risk Score and Plan: 4 or greater and Ondansetron, Dexamethasone, Midazolam, Scopolamine patch - Pre-op and Treatment may vary due to age or medical condition  Airway Management Planned: Oral ETT  Additional Equipment: None  Intra-op Plan:   Post-operative Plan: Extubation in OR  Informed Consent: I have reviewed the patients History and Physical, chart, labs and discussed the procedure including the risks, benefits and alternatives for the proposed anesthesia with the patient or authorized representative who has indicated his/her understanding and acceptance.     Dental advisory given  Plan Discussed with:  CRNA  Anesthesia Plan Comments:        Anesthesia Quick Evaluation

## 2020-07-07 NOTE — Anesthesia Postprocedure Evaluation (Signed)
Anesthesia Post Note  Patient: Christina Santos  Procedure(s) Performed: LAPAROSCOPIC CHOLECYSTECTOMY WITH INTRAOPERATIVE CHOLANGIOGRAM (N/A Abdomen)     Patient location during evaluation: PACU Anesthesia Type: General Level of consciousness: awake and alert, oriented and patient cooperative Pain management: pain level controlled Vital Signs Assessment: post-procedure vital signs reviewed and stable Respiratory status: spontaneous breathing, nonlabored ventilation and respiratory function stable Cardiovascular status: blood pressure returned to baseline and stable Postop Assessment: no apparent nausea or vomiting Anesthetic complications: no   No complications documented.  Last Vitals:  Vitals:   07/07/20 1245 07/07/20 1250  BP:  113/68  Pulse: 75 70  Resp: 16 14  Temp: 36.5 C   SpO2: 94% 95%    Last Pain:  Vitals:   07/07/20 1245  TempSrc:   PainSc: 3                  Lannie Fields

## 2020-07-07 NOTE — Op Note (Signed)
07/07/2020  9:44 AM  PATIENT:  Christina Santos  29 y.o. female  PRE-OPERATIVE DIAGNOSIS:  GALLSTONES  POST-OPERATIVE DIAGNOSIS:  GALLSTONES  PROCEDURE:  Procedure(s): LAPAROSCOPIC CHOLECYSTECTOMY WITH INTRAOPERATIVE CHOLANGIOGRAM (N/A)  SURGEON:  Surgeon(s) and Role:    * Griselda Miner, MD - Primary    * Ovidio Kin, MD  PHYSICIAN ASSISTANT:   ASSISTANTS: Dr. Ezzard Standing   ANESTHESIA:   local and general  EBL:  50cc   BLOOD ADMINISTERED:none  DRAINS: none   LOCAL MEDICATIONS USED:  MARCAINE     SPECIMEN:  Source of Specimen:  gallbladder  DISPOSITION OF SPECIMEN:  PATHOLOGY  COUNTS:  YES  TOURNIQUET:  * No tourniquets in log *  DICTATION: .Dragon Dictation     Procedure: After informed consent was obtained the patient was brought to the operating room and placed in the supine position on the operating room table. After adequate induction of general anesthesia the patient's abdomen was prepped with ChloraPrep allowed to dry and draped in usual sterile manner. An appropriate timeout was performed. The area below the umbilicus was infiltrated with quarter percent  Marcaine. A small incision was made with a 15 blade knife. The incision was carried down through the subcutaneous tissue bluntly with a hemostat and [employer] retractors. The linea alba was identified. The linea alba was incised with a 15 blade knife and each side was grasped with Coker clamps. The preperitoneal space was then probed with a hemostat until the peritoneum was opened and access was gained to the abdominal cavity. A 0 Vicryl pursestring stitch was placed in the fascia surrounding the opening. A Hassan cannula was then placed through the opening and anchored in place with the previously placed Vicryl purse string stitch. The abdomen was insufflated with carbon dioxide without difficulty. A laparoscope was inserted through the [facility] cannula in the right upper quadrant was inspected. Next the epigastric region was  infiltrated with % Marcaine. A small incision was made with a 15 blade knife. A 5 mm port was placed bluntly through this incision into the abdominal cavity under direct vision. Next 2 sites were chosen laterally on the right side of the abdomen for placement of 5 mm ports. Each of these areas was infiltrated with quarter percent Marcaine. Small stab incisions were made with a 15 blade knife. 5 mm ports were then placed bluntly through these incisions into the abdominal cavity under direct vision without difficulty. A blunt grasper was placed through the lateralmost 5 mm port and used to grasp the dome of the gallbladder and elevated anteriorly and superiorly. Another blunt grasper was placed through the other 5 mm port and used to retract the body and neck of the gallbladder. A dissector was placed through the epigastric port and using the electrocautery the peritoneal reflection at the gallbladder neck was opened. Blunt dissection was then carried out in this area until the gallbladder neck-cystic duct junction was readily identified and a good window was created. A single clip was placed on the gallbladder neck. A small  ductotomy was made just below the clip with laparoscopic scissors. A 14-gauge Angiocath was then placed through the anterior abdominal wall under direct vision. A Reddick cholangiogram catheter was then placed through the Angiocath and flushed. The catheter was then placed in the cystic duct and anchored in place with a clip. A cholangiogram was obtained that showed no filling defects good emptying into the duodenum an adequate length on the cystic duct. The anchoring clip and catheters were then  removed from the patient. 3 clips were placed proximally on the cystic duct and the duct was divided between the 2 sets of clips. Posterior to this the cystic artery was identified and again dissected bluntly in a circumferential manner until a good window  was created. 2 clips were placed proximally  and one distally on the artery and the artery was divided between the 2 sets of clips. Next a laparoscopic hook cautery device was used to separate the gallbladder from the liver bed. Prior to completely detaching the gallbladder from the liver bed the liver bed was inspected and several small bleeding points were coagulated with the electrocautery until the area was completely hemostatic. I also placed a piece of surgicel in the gallbladder bed. The gallbladder was then detached the rest of it from the liver bed without difficulty. A laparoscopic bag was inserted through the hassan port. The laparoscope was moved to the epigastric port. The gallbladder was placed within the bag and the bag was sealed.  The bag with the gallbladder was then removed with the [facility] cannula through the infraumbilical port without difficulty. The fascial defect was then closed with the previously placed Vicryl pursestring stitch as well as with another figure-of-eight 0 Vicryl stitch. The liver bed was inspected again and found to be hemostatic. The abdomen was irrigated with copious amounts of saline until the effluent was clear. The ports were then removed under direct vision without difficulty and were found to be hemostatic. The gas was allowed to escape. The skin incisions were all closed with interrupted 4-0 Monocryl subcuticular stitches. Dermabond dressings were applied. The patient tolerated the procedure well. At the end of the case all needle sponge and instrument counts were correct. The patient was then awakened and taken to recovery in stable condition  PLAN OF CARE: Discharge to home after PACU  PATIENT DISPOSITION:  PACU - hemodynamically stable.   Delay start of Pharmacological VTE agent (>24hrs) due to surgical blood loss or risk of bleeding: not applicable

## 2020-07-07 NOTE — H&P (Signed)
Christina Santos  Location: [facility] Patient #: [id number] DOB: 01-27-1991 Married / Language: English / Race: White Female   History of Present Illness  The patient is a 29 year old female who presents with abdominal pain. We are asked to see the patient in consultation by Dr. Nadyne Coombes to evaluate her for gallstones. The patient is a 29 year old white female who has been experiencing epigastric and low back pain for the last month. The pain is been fairly constant until the last week. The pain has been associated with some nausea but no vomiting. She apparently had an ultrasound at [facility] that showed stones in the gallbladder. That report is not yet available to [place] to review. She has been trying to follow a low-fat diet and the pain seems to be a bit better.   Past Surgical History  Cesarean Section - Multiple  Oral Surgery  Tonsillectomy   Diagnostic Studies History  Colonoscopy  never Mammogram  never Pap Smear  1-5 years ago  Allergies  Penicillins  Allergies Reconciled   Medication History  Dicyclomine HCl (20MG  Tablet, Oral) Active. M-Natal Plus (27-1MG  Tablet, Oral) Active. Sucralfate (1GM Tablet, Oral) Active. Medications Reconciled  Social History  Alcohol use  Occasional alcohol use. Caffeine use  Carbonated beverages, Coffee, Tea. No drug use  Tobacco use  Never smoker.  Family History  Hypertension  Father.  Pregnancy / Birth History Age at menarche  10 years. Contraceptive History  Intrauterine device. Gravida  4 Irregular periods  Maternal age  76-25 Para  3  Other Problems  Anxiety Disorder  Back Pain  Cholelithiasis  Depression     Review of Systems General Present- Fatigue and Weight Gain. Not Present- Appetite Loss, Chills, Fever, Night Sweats and Weight Loss. Skin Not Present- Change in Wart/Mole, Dryness, Hives, Jaundice, New Lesions, Non-Healing Wounds, Rash and Ulcer. HEENT Not Present-  Earache, Hearing Loss, Hoarseness, Nose Bleed, Oral Ulcers, Ringing in the Ears, Seasonal Allergies, Sinus Pain, Sore Throat, Visual Disturbances, Wears glasses/contact lenses and Yellow Eyes. Respiratory Not Present- Bloody sputum, Chronic Cough, Difficulty Breathing, Snoring and Wheezing. Breast Not Present- Breast Mass, Breast Pain, Nipple Discharge and Skin Changes. Cardiovascular Present- Swelling of Extremities. Not Present- Chest Pain, Difficulty Breathing Lying Down, Leg Cramps, Palpitations, Rapid Heart Rate and Shortness of Breath. Gastrointestinal Present- Abdominal Pain, Change in Bowel Habits and Indigestion. Not Present- Bloating, Bloody Stool, Chronic diarrhea, Constipation, Difficulty Swallowing, Excessive gas, Gets full quickly at meals, Hemorrhoids, Nausea, Rectal Pain and Vomiting. Female Genitourinary Not Present- Frequency, Nocturia, Painful Urination, Pelvic Pain and Urgency. Musculoskeletal Present- Back Pain, Joint Pain, Muscle Pain and Swelling of Extremities. Not Present- Joint Stiffness and Muscle Weakness. Neurological Present- Headaches and Tingling. Not Present- Decreased Memory, Fainting, Numbness, Seizures, Tremor, Trouble walking and Weakness. Psychiatric Present- Anxiety and Depression. Not Present- Bipolar, Change in Sleep Pattern, Fearful and Frequent crying. Endocrine Not Present- Cold Intolerance, Excessive Hunger, Hair Changes, Heat Intolerance, Hot flashes and New Diabetes. Hematology Not Present- Blood Thinners, Easy Bruising, Excessive bleeding, Gland problems, HIV and Persistent Infections.  Vitals  Weight: 231.25 lb Height: 59in Body Surface Area: 1.96 m Body Mass Index: 46.71 kg/m  Temp.: 98.62F  Pulse: 110 (Regular)  BP: 132/80(Sitting, Left Arm, Standard)       Physical Exam  General Mental Status-Alert. General Appearance-Consistent with stated age. Hydration-Well hydrated. Voice-Normal.  Head and  Neck Head-normocephalic, atraumatic with no lesions or palpable masses. Trachea-midline. Thyroid Gland Characteristics - normal size and consistency.  Eye Eyeball -  Bilateral-Extraocular movements intact. Sclera/Conjunctiva - Bilateral-No scleral icterus.  Chest and Lung Exam Chest and lung exam reveals -quiet, even and easy respiratory effort with no use of accessory muscles and on auscultation, normal breath sounds, no adventitious sounds and normal vocal resonance. Inspection Chest Wall - Normal. Back - normal.  Cardiovascular Cardiovascular examination reveals -normal heart sounds, regular rate and rhythm with no murmurs and normal pedal pulses bilaterally.  Abdomen Note: The abdomen is soft with mild epigastric tenderness. There is no palpable mass. There is no obvious hernia.   Neurologic Neurologic evaluation reveals -alert and oriented x 3 with no impairment of recent or remote memory. Mental Status-Normal.  Musculoskeletal Normal Exam - Left-Upper Extremity Strength Normal and Lower Extremity Strength Normal. Normal Exam - Right-Upper Extremity Strength Normal and Lower Extremity Strength Normal.  Lymphatic Head & Neck  General Head & Neck Lymphatics: Bilateral - Description - Normal. Axillary  General Axillary Region: Bilateral - Description - Normal. Tenderness - Non Tender. Femoral & Inguinal  Generalized Femoral & Inguinal Lymphatics: Bilateral - Description - Normal. Tenderness - Non Tender.    Assessment & Plan GALLSTONES (K80.20) Impression: The patient appears to have symptomatic gallstones. Because of the risk of further painful episodes and possible pancreatitis at Inc. she would benefit from having her gallbladder removed. She would also like to have this done. I have discussed with her in detail the risks and benefits of the operation to remove the gallbladder as well as some of the technical aspects including the risk of common  duct injury and she understands and wishes to proceed. We will plan for a laparoscopic cholecystectomy with intraoperative cholangiogram. We will also obtain her ultrasound to confirm that she truly has gallstones. This patient encounter took 30 minutes today to perform the following: take history, perform exam, review outside records, interpret imaging, counsel the patient on their diagnosis and document encounter, findings & plan in the EHR

## 2020-07-08 ENCOUNTER — Encounter (HOSPITAL_COMMUNITY): Payer: Self-pay | Admitting: General Surgery

## 2020-07-10 LAB — SURGICAL PATHOLOGY

## 2020-07-28 DIAGNOSIS — Z419 Encounter for procedure for purposes other than remedying health state, unspecified: Secondary | ICD-10-CM | POA: Diagnosis not present

## 2020-08-12 DIAGNOSIS — H5213 Myopia, bilateral: Secondary | ICD-10-CM | POA: Diagnosis not present

## 2020-08-28 DIAGNOSIS — Z419 Encounter for procedure for purposes other than remedying health state, unspecified: Secondary | ICD-10-CM | POA: Diagnosis not present

## 2020-08-31 ENCOUNTER — Encounter: Payer: Self-pay | Admitting: Neurology

## 2020-08-31 ENCOUNTER — Other Ambulatory Visit: Payer: Self-pay

## 2020-08-31 ENCOUNTER — Ambulatory Visit: Payer: Medicaid Other | Admitting: Neurology

## 2020-08-31 VITALS — BP 133/82 | HR 73 | Ht 59.0 in | Wt 237.5 lb

## 2020-08-31 DIAGNOSIS — G932 Benign intracranial hypertension: Secondary | ICD-10-CM

## 2020-08-31 DIAGNOSIS — R202 Paresthesia of skin: Secondary | ICD-10-CM

## 2020-08-31 MED ORDER — TOPIRAMATE 100 MG PO TABS: 100.0000 mg | ORAL_TABLET | Freq: Two times a day (BID) | ORAL | 11 refills | Status: DC

## 2020-08-31 NOTE — Progress Notes (Signed)
Chief Complaint  Patient presents with  . New Patient (Initial Visit)    Recent eye exam revealed slight blurred disk margins. Reports daily headaches. The intensity can be severe. Pain starts toward her temple area to the back of her head. She has pressure behind eyes. Sensitivity to light. She will occasionally take NSAIDS.  [facility]    Conley Rolls, My [place], [place]  . PCP    Dois Davenport, MD    HISTORICAL  Annaliesa Blann is a 29 year old female, seen in request by her optometrist Dr. Conley Rolls, My [place] for evaluation of bilateral papillary edema, initial evaluation was on August 31, 2020.  I reviewed and summarized the referring note past medical history Obesity Chronic migraine headaches  She noticed gradual onset visual change over the past few months, become more obvious since September 2021, she described take a moment to refocus with sudden positional change, as if she just shift her vision from looking at the snow to inside, she also describe intermittent blurriness, difficulty driving because of visual trouble at nighttime  She had long history of headaches, also getting worse over the past few months, almost daily she complains of bilateral frontal pressure headaches, during intense headache she complains of worsening light noise sensitivity, mild nausea lasting about 30 minutes, she take Tylenol as needed, which usually is helpful  She had about 40 pounds weight gain over the past 1 year, is on IUD, has 3 children,  She also complains of intermittent bilateral upper lower extremity paresthesia over the past few months, she denies gait abnormality, her mother was diagnosed with MS  Laboratory evaluation September 2021: Normal CMP, CBC hemoglobin of 11.8   REVIEW OF SYSTEMS: Full 14 system review of systems performed and notable only for as above All other review of systems were negative.  ALLERGIES: Allergies  Allergen Reactions  . Penicillins Other (See Comments)    Does not work     HOME MEDICATIONS: Current Outpatient Medications  Medication Sig Dispense Refill  . ergocalciferol (VITAMIN D2) 1.25 MG (50000 UT) capsule Take 50,000 Units by mouth once a week.    [facility] HYDROcodone-acetaminophen (NORCO/VICODIN) 5-325 MG tablet Take 1-2 tablets by mouth every 6 (six) hours as needed for moderate pain or severe pain. 15 tablet 0  . levonorgestrel (MIRENA) 20 MCG/24HR IUD 1 each by Intrauterine route once.    . Prenatal Vit-Fe Fumarate-FA (PRENATAL MULTIVITAMIN) TABS tablet Take 1 tablet by mouth daily at 12 noon.     No current facility-administered medications for this visit.    PAST MEDICAL HISTORY: Past Medical History:  Diagnosis Date  . PONV (postoperative nausea and vomiting)   . Pregnant     PAST SURGICAL HISTORY: Past Surgical History:  Procedure Laterality Date  . ADENOIDECTOMY Bilateral   . CESAREAN SECTION  2014, 2018, 2020  . CHOLECYSTECTOMY N/A 07/07/2020   Procedure: LAPAROSCOPIC CHOLECYSTECTOMY WITH INTRAOPERATIVE CHOLANGIOGRAM;  Surgeon: Griselda Miner, MD;  Location: [facility] OR;  Service: General;  Laterality: N/A;  . TONSILLECTOMY  2005-2006  . TYMPANOSTOMY TUBE PLACEMENT Bilateral     FAMILY HISTORY: No family history on file.  SOCIAL HISTORY: Social History   Socioeconomic History  . Marital status: Legally Separated    Spouse name: Not on file  . Number of children: Not on file  . Years of education: Not on file  . Highest education level: Not on file  Occupational History  . Not on file  Tobacco Use  . Smoking status: Never Smoker  .  Smokeless tobacco: Never Used  Vaping Use  . Vaping Use: Never used  Substance and Sexual Activity  . Alcohol use: Not Currently  . Drug use: Never  . Sexual activity: Yes    Birth control/protection: I.U.D., Surgical  Other Topics Concern  . Not on file  Social History Narrative  . Not on file   Social Determinants of Health   Financial Resource Strain:   . Difficulty of Paying Living  Expenses: Not on file  Food Insecurity:   . Worried About Programme researcher, broadcasting/film/video in the Last Year: Not on file  . Ran Out of Food in the Last Year: Not on file  Transportation Needs:   . Lack of Transportation (Medical): Not on file  . Lack of Transportation (Non-Medical): Not on file  Physical Activity:   . Days of Exercise per Week: Not on file  . Minutes of Exercise per Session: Not on file  Stress:   . Feeling of Stress : Not on file  Social Connections:   . Frequency of Communication with Friends and Family: Not on file  . Frequency of Social Gatherings with Friends and Family: Not on file  . Attends Religious Services: Not on file  . Active Member of Clubs or Organizations: Not on file  . Attends Banker Meetings: Not on file  . Marital Status: Not on file  Intimate Partner Violence:   . Fear of Current or Ex-Partner: Not on file  . Emotionally Abused: Not on file  . Physically Abused: Not on file  . Sexually Abused: Not on file     PHYSICAL EXAM   Vitals:   08/31/20 1524  Height: 4\' 11"  (1.499 m)   Not recorded     Body mass index is 46.45 kg/m.  PHYSICAL EXAMNIATION:  Gen: NAD, conversant, well nourised, well groomed                     Cardiovascular: Regular rate rhythm, no peripheral edema, warm, nontender. Eyes: Conjunctivae clear without exudates or hemorrhage Neck: Supple, no carotid bruits. Pulmonary: Clear to auscultation bilaterally   NEUROLOGICAL EXAM:  MENTAL STATUS: Speech:    Speech is normal; fluent and spontaneous with normal comprehension.  Cognition:     Orientation to time, place and person     Normal recent and remote memory     Normal Attention span and concentration     Normal Language, naming, repeating,spontaneous speech     Fund of knowledge   CRANIAL NERVES: CN II: Visual fields are full to confrontation. Pupils are round equal and briskly reactive to light. CN III, IV, VI: extraocular movement are normal. No  ptosis. CN V: Facial sensation is intact to light touch CN VII: Face is symmetric with normal eye closure  CN VIII: Hearing is normal to causal conversation. CN IX, X: Phonation is normal. CN XI: Head turning and shoulder shrug are intact  MOTOR: There is no pronator drift of out-stretched arms. Muscle bulk and tone are normal. Muscle strength is normal.  REFLEXES: Reflexes are 2+ and symmetric at the biceps, triceps, knees, and ankles. Plantar responses are flexor.  SENSORY: Intact to light touch, pinprick and vibratory sensation are intact in fingers and toes.  COORDINATION: There is no trunk or limb dysmetria noted.  GAIT/STANCE: Posture is normal. Gait is steady with normal steps, base, arm swing, and turning. Heel and toe walking are normal. Tandem gait is normal.  Romberg is absent.   DIAGNOSTIC  DATA (LABS, IMAGING, TESTING) - I reviewed patient records, labs, notes, testing and imaging myself where available.   ASSESSMENT AND PLAN  Christina Santos is a 29 y.o. female   Obesity Chronic migraine Paresthesia Probable pseudotumor cerebri  Dilated ophthalmology evaluation by her optometrist on August 09, 2020 showed slight blurred disc margin OD more than OS, by Dr. Conley Rolls  MRI of the brain  Laboratory evaluations to look for potential etiology for small fiber neuropathy  Topamax 100 mg twice a day  If MRI of the brain showed no structural abnormality, will refer her for lumbar puncture check opening pressure     Levert Feinstein, M.D. Ph.D.  [facility] [address] [place], [place] [address] Ph: [phone] Fax: [phone]  CC:  Conley Rolls My [place], [place] [address] [place],  [place] [address]  Dois Davenport, MD

## 2020-09-01 LAB — SEDIMENTATION RATE: Sed Rate: 21 mm/hr (ref 0–32)

## 2020-09-01 LAB — ANA W/REFLEX IF POSITIVE: Anti Nuclear Antibody (ANA): NEGATIVE

## 2020-09-01 LAB — RPR: RPR Ser Ql: NONREACTIVE

## 2020-09-01 LAB — C-REACTIVE PROTEIN: CRP: 24 mg/L — ABNORMAL HIGH (ref 0–10)

## 2020-09-01 LAB — TSH: TSH: 1.41 u[IU]/mL (ref 0.450–4.500)

## 2020-09-01 LAB — VITAMIN B12: Vitamin B-12: 622 pg/mL (ref 232–1245)

## 2020-09-01 LAB — HGB A1C W/O EAG: Hgb A1c MFr Bld: 5.9 % — ABNORMAL HIGH (ref 4.8–5.6)

## 2020-09-04 ENCOUNTER — Encounter: Payer: Self-pay | Admitting: *Deleted

## 2020-09-04 ENCOUNTER — Telehealth: Payer: Self-pay | Admitting: Neurology

## 2020-09-04 DIAGNOSIS — R102 Pelvic and perineal pain: Secondary | ICD-10-CM | POA: Diagnosis not present

## 2020-09-04 DIAGNOSIS — N920 Excessive and frequent menstruation with regular cycle: Secondary | ICD-10-CM | POA: Diagnosis not present

## 2020-09-04 DIAGNOSIS — G932 Benign intracranial hypertension: Secondary | ICD-10-CM

## 2020-09-04 DIAGNOSIS — Z30432 Encounter for removal of intrauterine contraceptive device: Secondary | ICD-10-CM | POA: Diagnosis not present

## 2020-09-04 NOTE — Telephone Encounter (Signed)
I have called patient, reviewed literature  Ther Adv Drug Saf. 2015 Jun; 6(3): 110-113. doi: 10.1177/2042098615588084 PMCID: [id number] PMID: 44628638 Risk of intracranial hypertension with intrauterine levonorgestrel Mahyar Etminan,corresponding author Alphonse Guild, and Debbe Odea  Our study is the first large epidemiologic study that that has examined the risk of ICH with Mirena. The results of our study demonstrate and increased reporting of ICH events with Mirena from the [employer]. The results from the cohort study suggest that the risk of EE-norgestimate, an oral formulation, is comparable with that with IUL. We found that compared with IUL, EE-norethindrone is protective for ICH although the small number of events and wide confidence intervals make interpretation of this observation challenging.  IUD does have associated with increased chance of intracranial hypertension, but likely comparable to oral formulations,  She is going to be evaluated by her gynecologist today, she had a history of tubal ligation, does not need contraceptives, IUD was given for menorrhagia,  She also reported allergic reaction to Topamax, has stopped the Topamax,  When we call her MRI of the brain result, will make a decision about medications,

## 2020-09-04 NOTE — Telephone Encounter (Signed)
I returned the call to the patient. She plans to keep her appt today for IUD removal.  She is agreeable to make further decisions about medication after her MRI brain has been completed.

## 2020-09-04 NOTE — Telephone Encounter (Signed)
Please see telephone note in Epic. Patient has been called.

## 2020-09-04 NOTE — Telephone Encounter (Signed)
Noted,  [facility] pending

## 2020-09-04 NOTE — Telephone Encounter (Signed)
Please call patient, laboratory evaluation showed mild elevated A1c 5.9, indicating mild elevated glucose level over the past few months.  She would benefit from exercise, diet control  Elevated C-reactive protein 24, in the setting of normal ESR 21, has unknown clinical significance, may consider repeat laboratory evaluation later  Rest of the laboratory evaluation showed no significant abnormalities.

## 2020-09-04 NOTE — Telephone Encounter (Signed)
I spoke to the patient to notified her of the lab results. She verbalized understanding and was in agreement to exercise and diet control.  She stopped topiramate due to intolerability. Reports itchy around neck/back, pressure around eyes (no visible swelling), tingling/numbness, cognitive fog.  She would like to try an alternate medication.  Additionally, she read that her IUD can be linked to pseudotumor cerebri. For this reason, she has scheduled an appt for it to be removed today. States she has it to regulate her menstrual cycle but does not need it for birth control. She has had tubal ligation. She wanted Dr. Zannie Cove opinion on this plan prior to her 1pm appt today.

## 2020-09-04 NOTE — Telephone Encounter (Signed)
Mychart message from patient:  I took first dose of medication last night. It has made me extremely itchy mainly around my neck and some on my spine. My eyes also have a swollen feeling and my head feels stuffy among the tingly you warned me of. I also feel very lethargic today. I didn't know if I am possibly having an allergic reaction to the medication.

## 2020-09-11 DIAGNOSIS — R102 Pelvic and perineal pain: Secondary | ICD-10-CM | POA: Diagnosis not present

## 2020-09-11 NOTE — Telephone Encounter (Signed)
schedule for 11/24 530pm

## 2020-09-11 NOTE — Telephone Encounter (Signed)
[facility]kley Harvey: [id number] (exp. 09/04/20 to 11/03/20) order faxed to [facility] they will reach out to the patient to schedule. Because they are who is in network with her insurance.

## 2020-09-18 DIAGNOSIS — N938 Other specified abnormal uterine and vaginal bleeding: Secondary | ICD-10-CM | POA: Diagnosis not present

## 2020-09-20 DIAGNOSIS — G932 Benign intracranial hypertension: Secondary | ICD-10-CM | POA: Diagnosis not present

## 2020-09-27 DIAGNOSIS — Z419 Encounter for procedure for purposes other than remedying health state, unspecified: Secondary | ICD-10-CM | POA: Diagnosis not present

## 2020-09-29 NOTE — Telephone Encounter (Signed)
Pt called to check on when physician will call me with results and what the next step will be.

## 2020-10-02 ENCOUNTER — Telehealth: Payer: Self-pay | Admitting: Neurology

## 2020-10-02 NOTE — Addendum Note (Signed)
Addended by: Levert Feinstein on: 10/02/2020 10:07 AM   Modules accepted: Orders

## 2020-10-02 NOTE — Telephone Encounter (Signed)
I spoke to the patient and she is aware of her MRI brain results.

## 2020-10-02 NOTE — Telephone Encounter (Signed)
MRI brain was normal at [facility] on September 20, 2020

## 2020-10-02 NOTE — Telephone Encounter (Signed)
The patient verbalized understanding of the MRI brain results. She is agreeable to move forward with the LP. She is aware to expect a call from [facility] to get it scheduled.

## 2020-10-02 NOTE — Telephone Encounter (Signed)
Please call patient, MRI of the brain is normal,  Will refer her to have fluoroscopy guided lumbar puncture, check opening pressure  Orders Placed This Encounter  Procedures  . DG FL GUIDED LUMBAR PUNCTURE

## 2020-10-28 DIAGNOSIS — Z419 Encounter for procedure for purposes other than remedying health state, unspecified: Secondary | ICD-10-CM | POA: Diagnosis not present

## 2020-10-31 ENCOUNTER — Ambulatory Visit
Admission: RE | Admit: 2020-10-31 | Discharge: 2020-10-31 | Disposition: A | Discharge status: Home / Self Care | Payer: Medicaid Other | Source: Ambulatory Visit | Attending: Neurology | Admitting: Neurology

## 2020-10-31 ENCOUNTER — Other Ambulatory Visit: Payer: Self-pay

## 2020-10-31 ENCOUNTER — Encounter: Payer: Self-pay | Admitting: Neurology

## 2020-10-31 DIAGNOSIS — G932 Benign intracranial hypertension: Secondary | ICD-10-CM

## 2020-10-31 NOTE — Discharge Instructions (Signed)

## 2020-11-03 LAB — CSF CELL COUNT WITH DIFFERENTIAL
RBC Count, CSF: 1 cells/uL — ABNORMAL HIGH
WBC, CSF: 0 cells/uL (ref 0–5)

## 2020-11-03 LAB — GRAM STAIN
MICRO NUMBER:: 11379915
SPECIMEN QUALITY:: ADEQUATE

## 2020-11-03 LAB — VDRL, CSF: VDRL Quant, CSF: NONREACTIVE

## 2020-11-03 LAB — GLUCOSE, CSF: Glucose, CSF: 53 mg/dL (ref 40–80)

## 2020-11-03 LAB — PROTEIN, CSF: Total Protein, CSF: 19 mg/dL (ref 15–45)

## 2020-11-28 DIAGNOSIS — Z419 Encounter for procedure for purposes other than remedying health state, unspecified: Secondary | ICD-10-CM | POA: Diagnosis not present

## 2020-12-06 ENCOUNTER — Ambulatory Visit: Payer: Medicaid Other | Admitting: Neurology

## 2020-12-06 ENCOUNTER — Encounter: Payer: Self-pay | Admitting: Neurology

## 2020-12-06 VITALS — BP 113/73 | HR 78 | Ht 59.0 in | Wt 232.0 lb

## 2020-12-06 DIAGNOSIS — G932 Benign intracranial hypertension: Secondary | ICD-10-CM

## 2020-12-06 DIAGNOSIS — Z6841 Body Mass Index (BMI) 40.0 and over, adult: Secondary | ICD-10-CM | POA: Diagnosis not present

## 2020-12-06 MED ORDER — ACETAZOLAMIDE 250 MG PO TABS: 500.0000 mg | ORAL_TABLET | Freq: Two times a day (BID) | ORAL | 5 refills | Status: DC

## 2020-12-06 NOTE — Patient Instructions (Addendum)
Start the Diamox 500 mg twice daily  Referral to eye doctor, Dr. Dione Booze  [facility]  Need to work on weight loss  See you back in 4 months   Acetazolamide Oral Tablets What is this medicine? ACETAZOLAMIDE (a set a ZOLE a mide) is a diuretic. It helps you make more urine and to lose salt and excess water from your body. It treats swelling from heart disease. It helps treat some seizures and some kinds of glaucoma. It also treats and prevents symptoms of altitude sickness (acute mountain sickness). This medicine may be used for other purposes; ask your health care provider or pharmacist if you have questions. COMMON BRAND NAME(S): Diamox What should I tell my health care provider before I take this medicine? They need to know if you have any of these conditions:  glaucoma  kidney disease  liver disease  low adrenal gland function  lung or breathing disease (COPD, chronic bronchitis, emphysema)  an unusual or allergic reaction to acetazolamide, sulfa drugs, other drugs, foods, dyes or preservatives  pregnant or trying to get pregnant  breast-feeding How should I use this medicine? Take this drug by mouth. Take it as directed on the prescription label at the same time every day. You can take it with or without food. If it upsets your stomach, take it with food. Keep taking it unless your health care provider tells you to stop. Talk to your health care provider about the use of this drug in children. Special care may be needed. Overdosage: If you think you have taken too much of this medicine contact a poison control center or emergency room at once. NOTE: This medicine is only for you. Do not share this medicine with others. What if I miss a dose? If you miss a dose, take it as soon as you can. If it is almost time for your next dose, take only that dose. Do not take double or extra doses. What may interact with this medicine? Do not take this  medicine with any of the following medications:  methazolamide This medicine may also interact with the following medications:  aspirin and aspirin-like medicines  cyclosporine  lithium  medicine for diabetes  methenamine  other diuretics  phenytoin  primidone  quinidine  sodium bicarbonate  stimulant medicines like dextroamphetamine This list may not describe all possible interactions. Give your health care provider a list of all the medicines, herbs, non-prescription drugs, or dietary supplements you use. Also tell them if you smoke, drink alcohol, or use illegal drugs. Some items may interact with your medicine. What should I watch for while using this medicine? Visit your health care provider for regular checks on your progress. Tell your health care provider if your symptoms do not start to get better or if they get worse. This drug may cause serious skin reactions. They can happen weeks to months after starting the drug. Contact your health care provider right away if you notice fevers or flu-like symptoms with a rash. The rash may be red or purple and then turn into blisters or peeling of the skin. Or, you might notice a red rash with swelling of the face, lips or lymph nodes in your neck or under your arms. You may get drowsy or dizzy. Do not drive, use machinery, or do anything that needs mental alertness until you know how this drug affects you. Do not stand up or sit up quickly, especially if you are an older  patient. This reduces the risk of dizzy or fainting spells. What side effects may I notice from receiving this medicine? Side effects that you should report to your doctor or health care provider as soon as possible:  allergic reactions (skin rash, itching or hives; swelling of the face, lips, or tongue)  high acid levels (trouble breathing; fast, irregular heartbeat; headache; confusion; unusually weak or tired; nausea, vomiting)  infection (fever, chills,  cough, sore throat, pain or trouble passing urine)  liver injury (dark yellow or brown urine; general ill feeling or flu-like symptoms; loss of appetite, right upper belly pain; unusually weak or tired, yellowing of the eyes or skin)  low red blood cell counts (trouble breathing; feeling faint; lightheaded, falls; unusually weak or tired)  redness, blistering, peeling, or loosening of the skin, including inside the mouth  unusual bruising or bleeding Side effects that usually do not require medical attention (report to your doctor or health care provider if they continue or are bothersome):  decreased hearing, ringing of the ears  diarrhea  increased thirst  kidney stones (blood in the urine; pain when urinating; pain the lower back or side)  loss of appetite  feeling faint or lightheaded, falls; muscle cramps or pain)  nausea  pain, tingling, numbness in the hands or feet  unusual sweating  vomiting This list may not describe all possible side effects. Call your doctor for medical advice about side effects. You may report side effects to FDA at 1-800-FDA-1088. Where should I keep my medicine? Keep out of the reach of children and pets. Store at room temperature between 20 and 25 degrees C (68 and 77 degrees F). Throw away any unused drug after the expiration date. NOTE: This sheet is a summary. It may not cover all possible information. If you have questions about this medicine, talk to your doctor, pharmacist, or health care provider.  2021 Elsevier/Gold Standard (2019-08-10 12:44:24)

## 2020-12-06 NOTE — Progress Notes (Signed)
PATIENT: Christina Santos DOB: 10/11/1991  REASON FOR VISIT: follow up HISTORY FROM: patient  HISTORY OF PRESENT ILLNESS: Today 12/06/20  HISTORY Christina Santos is a 30 year old female, seen in request by her optometrist Dr. Marin Comment, My [place] for evaluation of bilateral papillary edema, initial evaluation was on August 31, 2020.  I reviewed and summarized the referring note past medical history Obesity Chronic migraine headaches  She noticed gradual onset visual change over the past few months, become more obvious since September 2021, she described take a moment to refocus with sudden positional change, as if she just shift her vision from looking at the snow to inside, she also describe intermittent blurriness, difficulty driving because of visual trouble at nighttime  She had long history of headaches, also getting worse over the past few months, almost daily she complains of bilateral frontal pressure headaches, during intense headache she complains of worsening light noise sensitivity, mild nausea lasting about 30 minutes, she take Tylenol as needed, which usually is helpful  She had about 40 pounds weight gain over the past 1 year, is on IUD, has 3 children,  She also complains of intermittent bilateral upper lower extremity paresthesia over the past few months, she denies gait abnormality, her mother was diagnosed with MS  Laboratory evaluation September 2021: Normal CMP, CBC hemoglobin of 11.8  Update December 06, 2020 SS: Started Topamax 100 mg at night, that night she was up itching her neck. The next day felt groggy, itchy. Only took that 1 night. Had IUD taken out Mirena in November, was only on for heavy bleeding, has had tubal ligation.   Headaches were once in awhile, since LP and more stress, not wearing glasses, happens daily, frontal and in temples, feels like cramp in her head, not severe. No changes to vision or disturbance. No nausea, doesn't take anything, it will go away on  its own. Has not seen eye doctor since then.   Initial presenting symptom was blurry vision, went to [facility], was having static look in vision. This has improved, but she needs to wear her glasses more regularly.  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 Hasn't lost any weight, moved into a new house. Is going to start exercising at home.   A1c mildly elevated 5.9, elevated CRP 24, normal ESR 21  MRI of the brain was normal.   LP in Jan 2020 showed elevated opening pressure 26 cm water.   REVIEW OF SYSTEMS: Out of a complete 14 system review of symptoms, the patient complains only of the following symptoms, and all other reviewed systems are negative.  See HPI  ALLERGIES: Allergies  Allergen Reactions  . Topiramate Other (See Comments)    itchy around neck/back, pressure around eyes (no visible swelling), tingling/numbness, cognitive fog  . Penicillins Other (See Comments)    Does not work due to overuse.    HOME MEDICATIONS: Outpatient Medications Prior to Visit  Medication Sig Dispense Refill  . ergocalciferol (VITAMIN D2) 1.25 MG (50000 UT) capsule Take 50,000 Units by mouth once a week. (Patient not taking: Reported on 12/06/2020)    . Prenatal Vit-Fe Fumarate-FA (PRENATAL MULTIVITAMIN) TABS tablet Take 1 tablet by mouth daily at 12 noon. (Patient not taking: Reported on 12/06/2020)    . levonorgestrel (MIRENA) 20 MCG/24HR IUD 1 each by Intrauterine route once.     No facility-administered medications prior to visit.    PAST MEDICAL HISTORY: Past Medical History:  Diagnosis Date  . Headache   .  Low iron   . PONV (postoperative nausea and vomiting)   . Pregnant     PAST SURGICAL HISTORY: Past Surgical History:  Procedure Laterality Date  . ADENOIDECTOMY Bilateral   . CESAREAN SECTION  2014, 2018, 2020  . CHOLECYSTECTOMY N/A 07/07/2020   Procedure: LAPAROSCOPIC CHOLECYSTECTOMY WITH INTRAOPERATIVE CHOLANGIOGRAM;  Surgeon: Jovita Kussmaul, MD;  Location: [facility];   Service: General;  Laterality: N/A;  . TONSILLECTOMY  2005-2006  . TUBAL LIGATION    . TYMPANOSTOMY TUBE PLACEMENT Bilateral     FAMILY HISTORY: Family History  Problem Relation Age of Onset  . Multiple sclerosis Mother   . Diabetes Mother   . Hypertension Father     SOCIAL HISTORY: Social History   Socioeconomic History  . Marital status: Married    Spouse name: Not on file  . Number of children: 3  . Years of education: college  . Highest education level: Associate degree: academic program  Occupational History  . Occupation: stay at home mother  Tobacco Use  . Smoking status: Never Smoker  . Smokeless tobacco: Never Used  Vaping Use  . Vaping Use: Never used  Substance and Sexual Activity  . Alcohol use: Not Currently  . Drug use: Never  . Sexual activity: Yes    Birth control/protection: I.U.D., Surgical  Other Topics Concern  . Not on file  Social History Narrative   Lives at home with family.   Right-handed.   Caffeine use: one energy drink per day.   Social Determinants of Health   Financial Resource Strain: Not on file  Food Insecurity: Not on file  Transportation Needs: Not on file  Physical Activity: Not on file  Stress: Not on file  Social Connections: Not on file  Intimate Partner Violence: Not on file   PHYSICAL EXAM  Vitals:   12/06/20 1345  BP: 113/73  Pulse: 78  Weight: 232 lb (105.2 kg)  Height: '4\' 11"'  (1.499 m)   Body mass index is 46.86 kg/m.  Generalized: Well developed, in no acute distress   Neurological examination  Mentation: Alert oriented to time, place, history taking. Follows all commands speech and language fluent Cranial nerve II-XII: Pupils were equal round reactive to light. Extraocular movements were full, visual field were full on confrontational test. Facial sensation and strength were normal. Head turning and shoulder shrug  were normal and symmetric. Motor: The motor testing reveals 5 over 5 strength of all 4  extremities. Good symmetric motor tone is noted throughout.  Sensory: Sensory testing is intact to soft touch on all 4 extremities. No evidence of extinction is noted.  Coordination: Cerebellar testing reveals good finger-nose-finger and heel-to-shin bilaterally.  Gait and station: Gait is normal.   Reflexes: Deep tendon reflexes are symmetric and normal bilaterally.   DIAGNOSTIC DATA (LABS, IMAGING, TESTING) - I reviewed patient records, labs, notes, testing and imaging myself where available.  Lab Results  Component Value Date   WBC 11.4 (H) 06/29/2020   HGB 11.8 (L) 06/29/2020   HCT 38.3 06/29/2020   MCV 78.6 (L) 06/29/2020   PLT 297 06/29/2020      Component Value Date/Time   NA 138 06/29/2020 1150   K 4.1 06/29/2020 1150   CL 104 06/29/2020 1150   CO2 24 06/29/2020 1150   GLUCOSE 89 06/29/2020 1150   BUN 9 06/29/2020 1150   CREATININE 0.48 06/29/2020 1150   CALCIUM 8.9 06/29/2020 1150   PROT 7.5 06/29/2020 1150   ALBUMIN 3.4 (L) 06/29/2020 1150  AST 19 06/29/2020 1150   ALT 18 06/29/2020 1150   ALKPHOS 79 06/29/2020 1150   BILITOT 0.7 06/29/2020 1150   GFRNONAA >60 06/29/2020 1150   GFRAA >60 06/29/2020 1150   No results found for: CHOL, HDL, LDLCALC, LDLDIRECT, TRIG, CHOLHDL Lab Results  Component Value Date   HGBA1C 5.9 (H) 08/31/2020   Lab Results  Component Value Date   [id number] 001 08/31/2020   Lab Results  Component Value Date   TSH 1.410 08/31/2020   ASSESSMENT AND PLAN 30 y.o. year old female  has a past medical history of Headache, Low iron, PONV (postoperative nausea and vomiting), and Pregnant. here with:  1.  Obesity 2.  Chronic migraine headache 3.  Paresthesia 4.  Pseudotumor cerebri -Dilated ophthalmology evaluation August 09, 2020 showed slight blurred disc margin OD more than OS with Dr. Marin Comment -A1c mildly elevated 5.9, elevated CRP 24, normal ESR 21 -MRI of the brain was normal -LP in Jan 2020 showed elevated opening pressure 26 cm  water -Had reported intolerance with Topamax, itching, when took 100 mg at bedtime, only took once, she is afraid to retry at this point, I think we could in the future if needed challenge again -Add on Diamox 500 mg twice a day  -Referral to Dr. Katy Fitch ophthalmology, has not been seen since initial consultation at [place] center -Referral to healthy weight and wellness, we discussed that weight loss is key to management -Will check ESR, CRP at next visit -Follow-up in 4 months or sooner if needed  I spent 30 minutes of face-to-face and non-face-to-face time with patient.  This included previsit chart review, lab review, study review, order entry, electronic health record documentation, patient education.  Butler Denmark, AGNP-C, DNP 12/06/2020, 2:18 PM [facility] [address], [place], [place] [address] [phone]

## 2020-12-21 DIAGNOSIS — F329 Major depressive disorder, single episode, unspecified: Secondary | ICD-10-CM | POA: Diagnosis not present

## 2020-12-22 ENCOUNTER — Encounter: Payer: Self-pay | Admitting: Neurology

## 2020-12-26 DIAGNOSIS — Z419 Encounter for procedure for purposes other than remedying health state, unspecified: Secondary | ICD-10-CM | POA: Diagnosis not present

## 2021-01-17 DIAGNOSIS — G932 Benign intracranial hypertension: Secondary | ICD-10-CM | POA: Diagnosis not present

## 2021-01-17 DIAGNOSIS — H1045 Other chronic allergic conjunctivitis: Secondary | ICD-10-CM | POA: Diagnosis not present

## 2021-01-18 DIAGNOSIS — F411 Generalized anxiety disorder: Secondary | ICD-10-CM | POA: Diagnosis not present

## 2021-01-26 DIAGNOSIS — Z419 Encounter for procedure for purposes other than remedying health state, unspecified: Secondary | ICD-10-CM | POA: Diagnosis not present

## 2021-02-15 DIAGNOSIS — F411 Generalized anxiety disorder: Secondary | ICD-10-CM | POA: Diagnosis not present

## 2021-02-19 DIAGNOSIS — Z01419 Encounter for gynecological examination (general) (routine) without abnormal findings: Secondary | ICD-10-CM | POA: Diagnosis not present

## 2021-02-19 DIAGNOSIS — Z0000 Encounter for general adult medical examination without abnormal findings: Secondary | ICD-10-CM | POA: Diagnosis not present

## 2021-02-19 DIAGNOSIS — Z1151 Encounter for screening for human papillomavirus (HPV): Secondary | ICD-10-CM | POA: Diagnosis not present

## 2021-02-19 DIAGNOSIS — Z124 Encounter for screening for malignant neoplasm of cervix: Secondary | ICD-10-CM | POA: Diagnosis not present

## 2021-02-25 DIAGNOSIS — Z419 Encounter for procedure for purposes other than remedying health state, unspecified: Secondary | ICD-10-CM | POA: Diagnosis not present

## 2021-03-13 ENCOUNTER — Encounter (INDEPENDENT_AMBULATORY_CARE_PROVIDER_SITE_OTHER): Payer: Self-pay | Admitting: Family Medicine

## 2021-03-13 ENCOUNTER — Ambulatory Visit (INDEPENDENT_AMBULATORY_CARE_PROVIDER_SITE_OTHER): Payer: Medicaid Other | Admitting: Family Medicine

## 2021-03-13 ENCOUNTER — Other Ambulatory Visit: Payer: Self-pay

## 2021-03-13 ENCOUNTER — Other Ambulatory Visit (INDEPENDENT_AMBULATORY_CARE_PROVIDER_SITE_OTHER): Payer: Self-pay | Admitting: Family Medicine

## 2021-03-13 VITALS — BP 107/70 | HR 75 | Temp 97.6°F | Ht 59.0 in | Wt 222.0 lb

## 2021-03-13 DIAGNOSIS — Z0289 Encounter for other administrative examinations: Secondary | ICD-10-CM

## 2021-03-13 DIAGNOSIS — E559 Vitamin D deficiency, unspecified: Secondary | ICD-10-CM

## 2021-03-13 DIAGNOSIS — Z6841 Body Mass Index (BMI) 40.0 and over, adult: Secondary | ICD-10-CM

## 2021-03-13 DIAGNOSIS — R0602 Shortness of breath: Secondary | ICD-10-CM

## 2021-03-13 DIAGNOSIS — Z1331 Encounter for screening for depression: Secondary | ICD-10-CM | POA: Diagnosis not present

## 2021-03-13 DIAGNOSIS — R5383 Other fatigue: Secondary | ICD-10-CM | POA: Diagnosis not present

## 2021-03-13 DIAGNOSIS — D508 Other iron deficiency anemias: Secondary | ICD-10-CM

## 2021-03-13 DIAGNOSIS — R7303 Prediabetes: Secondary | ICD-10-CM

## 2021-03-14 DIAGNOSIS — E559 Vitamin D deficiency, unspecified: Secondary | ICD-10-CM | POA: Insufficient documentation

## 2021-03-14 DIAGNOSIS — D649 Anemia, unspecified: Secondary | ICD-10-CM | POA: Insufficient documentation

## 2021-03-14 DIAGNOSIS — R0602 Shortness of breath: Secondary | ICD-10-CM | POA: Insufficient documentation

## 2021-03-14 DIAGNOSIS — R7303 Prediabetes: Secondary | ICD-10-CM | POA: Insufficient documentation

## 2021-03-14 DIAGNOSIS — R5383 Other fatigue: Secondary | ICD-10-CM | POA: Insufficient documentation

## 2021-03-14 LAB — COMPREHENSIVE METABOLIC PANEL
ALT: 11 IU/L (ref 0–32)
AST: 14 IU/L (ref 0–40)
Albumin/Globulin Ratio: 1.4 (ref 1.2–2.2)
Albumin: 4 g/dL (ref 3.9–5.0)
Alkaline Phosphatase: 99 IU/L (ref 44–121)
BUN/Creatinine Ratio: 16 (ref 9–23)
BUN: 8 mg/dL (ref 6–20)
Bilirubin Total: 0.3 mg/dL (ref 0.0–1.2)
CO2: 25 mmol/L (ref 20–29)
Calcium: 8.6 mg/dL — ABNORMAL LOW (ref 8.7–10.2)
Chloride: 100 mmol/L (ref 96–106)
Creatinine, Ser: 0.49 mg/dL — ABNORMAL LOW (ref 0.57–1.00)
Globulin, Total: 2.9 g/dL (ref 1.5–4.5)
Glucose: 82 mg/dL (ref 65–99)
Potassium: 4.1 mmol/L (ref 3.5–5.2)
Sodium: 139 mmol/L (ref 134–144)
Total Protein: 6.9 g/dL (ref 6.0–8.5)
eGFR: 130 mL/min/{1.73_m2} (ref >59)

## 2021-03-14 LAB — T3: T3, Total: 152 ng/dL (ref 71–180)

## 2021-03-14 LAB — CBC WITH DIFFERENTIAL
Basophils Absolute: 0 10*3/uL (ref 0.0–0.2)
Basos: 0 %
EOS (ABSOLUTE): 0.2 10*3/uL (ref 0.0–0.4)
Eos: 2 %
Hematocrit: 36.5 % (ref 34.0–46.6)
Hemoglobin: 11.4 g/dL (ref 11.1–15.9)
Immature Grans (Abs): 0 10*3/uL (ref 0.0–0.1)
Immature Granulocytes: 0 %
Lymphocytes Absolute: 2.5 10*3/uL (ref 0.7–3.1)
Lymphs: 30 %
MCH: 24.2 pg — ABNORMAL LOW (ref 26.6–33.0)
MCHC: 31.2 g/dL — ABNORMAL LOW (ref 31.5–35.7)
MCV: 78 fL — ABNORMAL LOW (ref 79–97)
Monocytes Absolute: 0.5 10*3/uL (ref 0.1–0.9)
Monocytes: 5 %
Neutrophils Absolute: 5.3 10*3/uL (ref 1.4–7.0)
Neutrophils: 63 %
RBC: 4.71 x10E6/uL (ref 3.77–5.28)
RDW: 15.5 % — ABNORMAL HIGH (ref 11.7–15.4)
WBC: 8.5 10*3/uL (ref 3.4–10.8)

## 2021-03-14 LAB — INSULIN, RANDOM: INSULIN: 7.4 u[IU]/mL (ref 2.6–24.9)

## 2021-03-14 LAB — T4: T4, Total: 8.7 ug/dL (ref 4.5–12.0)

## 2021-03-14 LAB — LIPID PANEL WITH LDL/HDL RATIO
Cholesterol, Total: 180 mg/dL (ref 100–199)
HDL: 44 mg/dL (ref >39)
LDL Chol Calc (NIH): 113 mg/dL — ABNORMAL HIGH (ref 0–99)
LDL/HDL Ratio: 2.6 ratio (ref 0.0–3.2)
Triglycerides: 126 mg/dL (ref 0–149)
VLDL Cholesterol Cal: 23 mg/dL (ref 5–40)

## 2021-03-14 LAB — VITAMIN B12: Vitamin B-12: 833 pg/mL (ref 232–1245)

## 2021-03-14 LAB — TSH: TSH: 1.32 u[IU]/mL (ref 0.450–4.500)

## 2021-03-14 LAB — HEMOGLOBIN A1C
Est. average glucose Bld gHb Est-mCnc: 117 mg/dL
Hgb A1c MFr Bld: 5.7 % — ABNORMAL HIGH (ref 4.8–5.6)

## 2021-03-14 LAB — VITAMIN D 25 HYDROXY (VIT D DEFICIENCY, FRACTURES): Vit D, 25-Hydroxy: 18.7 ng/mL — ABNORMAL LOW (ref 30.0–100.0)

## 2021-03-14 LAB — FOLATE: Folate: 8.8 ng/mL (ref >3.0)

## 2021-03-14 NOTE — Progress Notes (Signed)
Chief Complaint:   OBESITY Jacquelinne Speak (MR# [id number]) is a 30 y.o. female who presents for evaluation and treatment of obesity and related comorbidities. Current BMI is Body mass index is 44.84 kg/m. Evangelyne has been struggling with her weight for many years and has been unsuccessful in either losing weight, maintaining weight loss, or reaching her healthy weight goal.  Tyisha is currently in the action stage of change and ready to dedicate time achieving and maintaining a healthier weight. Erina is interested in becoming our patient and working on intensive lifestyle modifications including (but not limited to) diet and exercise for weight loss.  Nesha's habits were reviewed today and are as follows: Her family eats meals together, she thinks her family will eat healthier with her, her desired weight loss is 92 lbs, she has been heavy most of her life, she started gaining weight with pregnancies and after, her heaviest weight ever was 238 pounds, she is a picky eater and doesn't like to eat healthier foods, she has significant food cravings issues, she snacks frequently in the evenings, she skips meals frequently, she is trying to follow a vegetarian diet, she frequently makes poor food choices, she has problems with excessive hunger, she frequently eats larger portions than normal and she struggles with emotional eating.  Depression Screen Paislea's Food and Mood (modified PHQ-9) score was 26.  Depression screen PHQ 2/9 03/13/2021  Decreased Interest 1  Down, Depressed, Hopeless 1  PHQ - 2 Score 2  Altered sleeping 0  Tired, decreased energy 0  Change in appetite 2  Feeling bad or failure about yourself  1  Trouble concentrating 1  Moving slowly or fidgety/restless 0  Suicidal thoughts 0  PHQ-9 Score 6   Subjective:   1. Other fatigue Jashanti admits to daytime somnolence and admits to waking up still tired. Patent has a history of symptoms of daytime fatigue and morning headache.  Emmily generally gets 8 hours of sleep per night, and states that she has generally restful sleep. Snoring is not present. Apneic episodes are present. Epworth Sleepiness Score is 6.  2. SOB (shortness of breath) on exertion Para notes increasing shortness of breath with exercising and seems to be worsening over time with weight gain. She notes getting out of breath sooner with activity than she used to. This has not gotten worse recently. Railey denies shortness of breath at rest or orthopnea.  3. Pre-diabetes Anne-Marie has a history of elevated glucose and A1c. She is ready to work on diet and exercise.  4. Other iron deficiency anemia Mataya is not on iron supplement. Last hemoglobin was low at 11.8. She doesn't eat much meat. She denies GI surgeries.  5. Vitamin D deficiency Sharra is not on Vit D. She has a history of Vit D deficiency and she notes fatigue.  Assessment/Plan:   1. Other fatigue Heavin does feel that her weight is causing her energy to be lower than it should be. Fatigue may be related to obesity, depression or many other causes. Labs will be ordered, and in the meanwhile, Jaelah will focus on self care including making healthy food choices, increasing physical activity and focusing on stress reduction.  - Comprehensive metabolic panel - TSH - Lipid Panel With LDL/HDL Ratio - Folate - T3 - T4 - EKG 12-Lead  2. SOB (shortness of breath) on exertion Daryana does feel that she gets out of breath more easily that she used to when she exercises. Aleeya's shortness of  breath appears to be obesity related and exercise induced. She has agreed to work on weight loss and gradually increase exercise to treat her exercise induced shortness of breath. Will continue to monitor closely.  3. Pre-diabetes Leida will start her eating plan, and will continue to work on weight loss, exercise, and decreasing simple carbohydrates to help decrease the risk of diabetes. We will check labs today.  -  Insulin, random - Hemoglobin A1c  4. Other iron deficiency anemia We will check labs today. Velera will follow up as directed. Orders and follow up as documented in patient record.  - Vitamin B12 - CBC With Differential  5. Vitamin D deficiency Low Vitamin D level contributes to fatigue and are associated with obesity, breast, and colon cancer. We will check labs today. Vada will follow-up for routine testing of Vitamin D, at least 2-3 times per year to avoid over-replacement.  - VITAMIN D 25 Hydroxy (Vit-D Deficiency, Fractures)  6. Screening for depression Kitt had a positive depression screening. Depression is commonly associated with obesity and often results in emotional eating behaviors. We will monitor this closely and work on CBT to help improve the non-hunger eating patterns. Referral to Psychology may be required if no improvement is seen as she continues in our clinic.  7. Obesity with current BMI 44.8 Zalia is currently in the action stage of change and her goal is to continue with weight loss efforts. I recommend Suella begin the structured treatment plan as follows:  She has agreed to the Vegetarian Plan.  Exercise goals: No exercise has been prescribed for now, while we concentrate on nutritional changes.  Behavioral modification strategies: increasing lean protein intake.  She was informed of the importance of frequent follow-up visits to maximize her success with intensive lifestyle modifications for her multiple health conditions. She was informed we would discuss her lab results at her next visit unless there is a critical issue that needs to be addressed sooner. Milinda agreed to keep her next visit at the agreed upon time to discuss these results.  Objective:   Blood pressure 107/70, pulse 75, temperature 97.6 F (36.4 C), height 4\' 11"  (1.499 m), weight 222 lb (100.7 kg), SpO2 97 %. Body mass index is 44.84 kg/m.  EKG: Normal sinus rhythm, rate 72  BPM.  Indirect Calorimeter completed today shows a VO2 of 314 and a REE of 2188.  Her calculated basal metabolic rate is 2189 thus her basal metabolic rate is better than expected.  General: Cooperative, alert, well developed, in no acute distress. HEENT: Conjunctivae and lids unremarkable. Cardiovascular: Regular rhythm.  Lungs: Normal work of breathing. Neurologic: No focal deficits.   Lab Results  Component Value Date   CREATININE 0.49 (L) 03/13/2021   BUN 8 03/13/2021   NA 139 03/13/2021   K 4.1 03/13/2021   CL 100 03/13/2021   CO2 25 03/13/2021   Lab Results  Component Value Date   ALT 11 03/13/2021   AST 14 03/13/2021   ALKPHOS 99 03/13/2021   BILITOT 0.3 03/13/2021   Lab Results  Component Value Date   HGBA1C 5.7 (H) 03/13/2021   HGBA1C 5.9 (H) 08/31/2020   Lab Results  Component Value Date   INSULIN 7.4 03/13/2021   Lab Results  Component Value Date   TSH 1.320 03/13/2021   Lab Results  Component Value Date   CHOL 180 03/13/2021   HDL 44 03/13/2021   LDLCALC 113 (H) 03/13/2021   TRIG 126 03/13/2021   Lab  Results  Component Value Date   WBC 8.5 03/13/2021   HGB 11.4 03/13/2021   HCT 36.5 03/13/2021   MCV 78 (L) 03/13/2021   PLT 297 06/29/2020   No results found for: IRON, TIBC, FERRITIN  Attestation Statements:   Reviewed by clinician on day of visit: allergies, medications, problem list, medical history, surgical history, family history, social history, and previous encounter notes.  Time spent on visit including pre-visit chart review and post-visit charting and care was 60 minutes.    I, Burt Knack, am acting as transcriptionist for Quillian Quince, MD.  I have reviewed the above documentation for accuracy and completeness, and I agree with the above. - Quillian Quince, MD

## 2021-03-27 ENCOUNTER — Ambulatory Visit (INDEPENDENT_AMBULATORY_CARE_PROVIDER_SITE_OTHER): Payer: Medicaid Other | Admitting: Family Medicine

## 2021-03-27 ENCOUNTER — Other Ambulatory Visit: Payer: Self-pay

## 2021-03-27 ENCOUNTER — Encounter (INDEPENDENT_AMBULATORY_CARE_PROVIDER_SITE_OTHER): Payer: Self-pay | Admitting: Family Medicine

## 2021-03-27 VITALS — BP 124/82 | HR 84 | Temp 98.3°F | Ht 59.0 in | Wt 218.0 lb

## 2021-03-27 DIAGNOSIS — Z6841 Body Mass Index (BMI) 40.0 and over, adult: Secondary | ICD-10-CM | POA: Diagnosis not present

## 2021-03-27 DIAGNOSIS — E559 Vitamin D deficiency, unspecified: Secondary | ICD-10-CM

## 2021-03-27 DIAGNOSIS — E7849 Other hyperlipidemia: Secondary | ICD-10-CM

## 2021-03-27 DIAGNOSIS — E7800 Pure hypercholesterolemia, unspecified: Secondary | ICD-10-CM

## 2021-03-27 DIAGNOSIS — R7303 Prediabetes: Secondary | ICD-10-CM | POA: Diagnosis not present

## 2021-03-27 MED ORDER — VITAMIN D (ERGOCALCIFEROL) 1.25 MG (50000 UNIT) PO CAPS: 50000.0000 [IU] | ORAL_CAPSULE | ORAL | 0 refills | Status: DC

## 2021-03-27 MED ORDER — METFORMIN HCL 500 MG PO TABS: 500.0000 mg | ORAL_TABLET | Freq: Every day | ORAL | 0 refills | Status: DC

## 2021-03-28 DIAGNOSIS — Z419 Encounter for procedure for purposes other than remedying health state, unspecified: Secondary | ICD-10-CM | POA: Diagnosis not present

## 2021-03-28 NOTE — Progress Notes (Signed)
Chief Complaint:   OBESITY Christina Santos is here to discuss her progress with her obesity treatment plan along with follow-up of her obesity related diagnoses. Christina Santos is on the Vegetarian Plan and states she is following her eating plan approximately 100% of the time. Christina Santos states she is doing cardio and resistance training for 45-60 minutes 3 times per week.  Today's visit was #: 2 Starting weight: 222 lbs Starting date: 03/13/2021 Today's weight: 218 lbs Today's date: 03/27/2021 Total lbs lost to date: 4 Total lbs lost since last in-office visit: 4  Interim History: Christina Santos has done well with weight loss on her plan. She gets good support from her husband and was able to eat all her food. Her hunger was mostly controlled, but she is likely to be bored on her plan sooner than later.  Subjective:   1. Hyperlipidemia, pure Christina Santos's LDL is mildly elevated. She is not at a point to need a statin. She is working on diet and weight loss. I discussed labs with the patient today.  2. Vitamin D deficiency Christina Santos's Vit D level is low and she notes fatigue. She is not on Vit D currently. I discussed labs with the patient today.  3. Pre-diabetes Christina Santos's A1c is elevated at 5.7, but this is improved from 5.9 last year. Her polyphagia has improved on diet and exercise.  Assessment/Plan:   1. Hyperlipidemia, pure Cardiovascular risk and specific lipid/LDL goals reviewed. We discussed several lifestyle modifications today. Christina Santos will continue to work on diet, exercise and weight loss efforts. We will recheck labs in 3 months. Orders and follow up as documented in patient record.   2. Vitamin D deficiency Low Vitamin D level contributes to fatigue and are associated with obesity, breast, and colon cancer. Christina Santos agreed to start prescription Vitamin D 50,000 IU every week with no refills. She will follow-up for routine testing of Vitamin D, at least 2-3 times per year to avoid over-replacement.  - Vitamin D,  Ergocalciferol, (DRISDOL) 1.25 MG (50000 UNIT) CAPS capsule; Take 1 capsule (50,000 Units total) by mouth every 7 (seven) days.  Dispense: 4 capsule; Refill: 0  3. Pre-diabetes Christina Santos agreed to start metformin 500 mg q AM with food, with no refills. She will continue to work on weight loss, exercise, and decreasing simple carbohydrates to help decrease the risk of diabetes.   - metFORMIN (GLUCOPHAGE) 500 MG tablet; Take 1 tablet (500 mg total) by mouth daily with breakfast.  Dispense: 30 tablet; Refill: 0  4. Obesity with current BMI 44.2 Christina Santos is currently in the action stage of change. As such, her goal is to continue with weight loss efforts. She has agreed to following the Vegetarian Plan or keeping a food journal and adhering to recommended goals of 1400-1600 calories and 85+ grams of protein daily.   Exercise goals: As is.  Behavioral modification strategies: decreasing simple carbohydrates, meal planning and cooking strategies and dealing with family or coworker sabotage.  Christina Santos has agreed to follow-up with our clinic in 2 to 3 weeks. She was informed of the importance of frequent follow-up visits to maximize her success with intensive lifestyle modifications for her multiple health conditions.   Objective:   Blood pressure 124/82, pulse 84, temperature 98.3 F (36.8 C), height 4\' 11"  (1.499 m), weight 218 lb (98.9 kg), SpO2 96 %. Body mass index is 44.03 kg/m.  General: Cooperative, alert, well developed, in no acute distress. HEENT: Conjunctivae and lids unremarkable. Cardiovascular: Regular rhythm.  Lungs: Normal  work of breathing. Neurologic: No focal deficits.   Lab Results  Component Value Date   CREATININE 0.49 (L) 03/13/2021   BUN 8 03/13/2021   NA 139 03/13/2021   K 4.1 03/13/2021   CL 100 03/13/2021   CO2 25 03/13/2021   Lab Results  Component Value Date   ALT 11 03/13/2021   AST 14 03/13/2021   ALKPHOS 99 03/13/2021   BILITOT 0.3 03/13/2021   Lab Results   Component Value Date   HGBA1C 5.7 (H) 03/13/2021   HGBA1C 5.9 (H) 08/31/2020   Lab Results  Component Value Date   INSULIN 7.4 03/13/2021   Lab Results  Component Value Date   TSH 1.320 03/13/2021   Lab Results  Component Value Date   CHOL 180 03/13/2021   HDL 44 03/13/2021   LDLCALC 113 (H) 03/13/2021   TRIG 126 03/13/2021   Lab Results  Component Value Date   WBC 8.5 03/13/2021   HGB 11.4 03/13/2021   HCT 36.5 03/13/2021   MCV 78 (L) 03/13/2021   PLT 297 06/29/2020   No results found for: IRON, TIBC, FERRITIN  Attestation Statements:   Reviewed by clinician on day of visit: allergies, medications, problem list, medical history, surgical history, family history, social history, and previous encounter notes.  Time spent on visit including pre-visit chart review and post-visit care and charting was 40 minutes.    I, Burt Knack, am acting as transcriptionist for Quillian Quince, MD.  I have reviewed the above documentation for accuracy and completeness, and I agree with the above. -  Quillian Quince, MD

## 2021-04-05 ENCOUNTER — Ambulatory Visit: Payer: Medicaid Other | Admitting: Neurology

## 2021-04-12 DIAGNOSIS — F411 Generalized anxiety disorder: Secondary | ICD-10-CM | POA: Diagnosis not present

## 2021-04-16 ENCOUNTER — Ambulatory Visit (INDEPENDENT_AMBULATORY_CARE_PROVIDER_SITE_OTHER): Payer: Medicaid Other | Admitting: Bariatrics

## 2021-04-17 ENCOUNTER — Other Ambulatory Visit: Payer: Self-pay

## 2021-04-17 ENCOUNTER — Ambulatory Visit (INDEPENDENT_AMBULATORY_CARE_PROVIDER_SITE_OTHER): Payer: Medicaid Other | Admitting: Family Medicine

## 2021-04-17 ENCOUNTER — Encounter (INDEPENDENT_AMBULATORY_CARE_PROVIDER_SITE_OTHER): Payer: Self-pay | Admitting: Family Medicine

## 2021-04-17 VITALS — BP 104/69 | HR 82 | Temp 98.1°F | Ht 59.0 in | Wt 221.0 lb

## 2021-04-17 DIAGNOSIS — R7303 Prediabetes: Secondary | ICD-10-CM | POA: Diagnosis not present

## 2021-04-17 DIAGNOSIS — Z6841 Body Mass Index (BMI) 40.0 and over, adult: Secondary | ICD-10-CM | POA: Diagnosis not present

## 2021-04-17 DIAGNOSIS — E559 Vitamin D deficiency, unspecified: Secondary | ICD-10-CM | POA: Diagnosis not present

## 2021-04-17 MED ORDER — VITAMIN D (ERGOCALCIFEROL) 1.25 MG (50000 UNIT) PO CAPS: 50000.0000 [IU] | ORAL_CAPSULE | ORAL | 0 refills | Status: DC

## 2021-04-17 MED ORDER — METFORMIN HCL 500 MG PO TABS: 500.0000 mg | ORAL_TABLET | Freq: Two times a day (BID) | ORAL | 0 refills | Status: DC

## 2021-04-23 NOTE — Progress Notes (Signed)
Chief Complaint:   OBESITY Christina Santos is here to discuss her progress with her obesity treatment plan along with follow-up of her obesity related diagnoses. Christina Santos is on keeping a food journal and adhering to recommended goals of 1400-1600 calories and 85+ grams of protein daily or the [place] and states she is following her eating plan approximately 90% of the time. Christina Santos states she is doing resistance training for 60 minutes 2 times per week.  Today's visit was #: 3 Starting weight: 222 lbs Starting date: 03/13/2021 Today's weight: 221 lbs Today's date: 04/17/2021 Total lbs lost to date: 1 Total lbs lost since last in-office visit: 0  Interim History: Christina Santos has regained some of the weight that she has lost so far. She has tried to be mindful of her food choices, and keep track of the nutrition in her head but she may not have met both of her goals.  Subjective:   1. Pre-diabetes Christina Santos is stable on metformin, and she notes decreased polyphagia in the morning, but it returns in the afternoon.  2. Vitamin D deficiency Christina Santos is stable on Vit D, but her level is not yet at goal.  Assessment/Plan:   1. Pre-diabetes Christina Santos agreed to increase metformin to 500 mg BID with no refills. She will continue to work on weight loss, exercise, and decreasing simple carbohydrates to help decrease the risk of diabetes.   - metFORMIN (GLUCOPHAGE) 500 MG tablet; Take 1 tablet (500 mg total) by mouth 2 (two) times daily.  Dispense: 60 tablet; Refill: 0  2. Vitamin D deficiency Low Vitamin D level contributes to fatigue and are associated with obesity, breast, and colon cancer. We will refill prescription Vitamin D for 1 month. Christina Santos will follow-up for routine testing of Vitamin D, at least 2-3 times per year to avoid over-replacement.  - Vitamin D, Ergocalciferol, (DRISDOL) 1.25 MG (50000 UNIT) CAPS capsule; Take 1 capsule (50,000 Units total) by mouth every 7 (seven) days.  Dispense: 4 capsule;  Refill: 0  3. Obesity with current BMI 44.7 Keimani is currently in the action stage of change. As such, her goal is to continue with weight loss efforts. She has agreed to keeping a food journal and adhering to recommended goals of 1400-1600 calories and 85+ grams of protein daily.   Exercise goals: As is.  Behavioral modification strategies: increasing water intake and keeping a strict food journal.  [name] has agreed to follow-up with our clinic in 2 to 3 weeks. She was informed of the importance of frequent follow-up visits to maximize her success with intensive lifestyle modifications for her multiple health conditions.   Objective:   Blood pressure 104/69, pulse 82, temperature 98.1 F (36.7 C), height '4\' 11"'  (1.499 m), weight 221 lb (100.2 kg), SpO2 97 %. Body mass index is 44.64 kg/m.  General: Cooperative, alert, well developed, in no acute distress. HEENT: Conjunctivae and lids unremarkable. Cardiovascular: Regular rhythm.  Lungs: Normal work of breathing. Neurologic: No focal deficits.   Lab Results  Component Value Date   CREATININE 0.49 (L) 03/13/2021   BUN 8 03/13/2021   NA 139 03/13/2021   K 4.1 03/13/2021   CL 100 03/13/2021   CO2 25 03/13/2021   Lab Results  Component Value Date   ALT 11 03/13/2021   AST 14 03/13/2021   ALKPHOS 99 03/13/2021   BILITOT 0.3 03/13/2021   Lab Results  Component Value Date   HGBA1C 5.7 (H) 03/13/2021   HGBA1C 5.9 (H) 08/31/2020  Lab Results  Component Value Date   INSULIN 7.4 03/13/2021   Lab Results  Component Value Date   TSH 1.320 03/13/2021   Lab Results  Component Value Date   CHOL 180 03/13/2021   HDL 44 03/13/2021   LDLCALC 113 (H) 03/13/2021   TRIG 126 03/13/2021   Lab Results  Component Value Date   WBC 8.5 03/13/2021   HGB 11.4 03/13/2021   HCT 36.5 03/13/2021   MCV 78 (L) 03/13/2021   PLT 297 06/29/2020   No results found for: IRON, TIBC, FERRITIN  Attestation Statements:   Reviewed by  clinician on day of visit: allergies, medications, problem list, medical history, surgical history, family history, social history, and previous encounter notes.   I, Trixie Dredge, am acting as transcriptionist for Dennard Nip, MD.  I have reviewed the above documentation for accuracy and completeness, and I agree with the above. -  Dennard Nip, MD

## 2021-04-27 DIAGNOSIS — Z419 Encounter for procedure for purposes other than remedying health state, unspecified: Secondary | ICD-10-CM | POA: Diagnosis not present

## 2021-05-01 ENCOUNTER — Other Ambulatory Visit: Payer: Self-pay

## 2021-05-01 ENCOUNTER — Encounter (INDEPENDENT_AMBULATORY_CARE_PROVIDER_SITE_OTHER): Payer: Self-pay | Admitting: Family Medicine

## 2021-05-01 ENCOUNTER — Ambulatory Visit (INDEPENDENT_AMBULATORY_CARE_PROVIDER_SITE_OTHER): Payer: Medicaid Other | Admitting: Family Medicine

## 2021-05-01 VITALS — BP 135/82 | HR 78 | Temp 98.2°F | Ht 59.0 in | Wt 219.0 lb

## 2021-05-01 DIAGNOSIS — Z6841 Body Mass Index (BMI) 40.0 and over, adult: Secondary | ICD-10-CM | POA: Diagnosis not present

## 2021-05-01 DIAGNOSIS — F3289 Other specified depressive episodes: Secondary | ICD-10-CM | POA: Diagnosis not present

## 2021-05-01 DIAGNOSIS — R7303 Prediabetes: Secondary | ICD-10-CM | POA: Diagnosis not present

## 2021-05-01 MED ORDER — METFORMIN HCL 500 MG PO TABS: 500.0000 mg | ORAL_TABLET | Freq: Two times a day (BID) | ORAL | 0 refills | Status: DC

## 2021-05-07 NOTE — Progress Notes (Signed)
Chief Complaint:   OBESITY Christina Santos is here to discuss her progress with her obesity treatment plan along with follow-up of her obesity related diagnoses. Christina Santos is on keeping a food journal and adhering to recommended goals of 1400-1600 calories and 85+ grams of protein daily and states she is following her eating plan approximately 75% of the time. Christina Santos states she is doing 0 minutes 0 times per week.  Today's visit was #: 4 Starting weight: 222 lbs Starting date: 03/13/2021 Today's weight: 219 lbs Today's date: 05/01/2021 Total lbs lost to date: 3 Total lbs lost since last in-office visit: 2  Interim History: Christina Santos continues to do well with weight loss. She has extra challenges and sometimes struggled to journal, but she still did well with being mindful and trying to increase her protein.  Subjective:   1. Pre-diabetes Marshawn continues to do well with weight loss and she is tolerating metformin well. She denies nausea, vomiting, or hypoglycemia.  2. Other depression, with emotional eating Christina Santos appears to be doing better with decreasing emotional eating behaviors. Her mood is stable and she is looking forward to good things in her future.  Assessment/Plan:   1. Pre-diabetes Christina Santos will continue to work on weight loss, exercise, and decreasing simple carbohydrates to help decrease the risk of diabetes. We will refill metformin for 1 month.  - metFORMIN (GLUCOPHAGE) 500 MG tablet; Take 1 tablet (500 mg total) by mouth 2 (two) times daily.  Dispense: 60 tablet; Refill: 0  2. Other depression, with emotional eating Behavior modification techniques were discussed today to help Christina Santos deal with her emotional/non-hunger eating behaviors. Christina Santos will continue her medications and efforts to improve health, and will continue to monitor. Orders and follow up as documented in patient record.   3. Obesity with current BMI of 44.2 Christina Santos is currently in the action stage of change. As such, her  goal is to continue with weight loss efforts. She has agreed to keeping a food journal and adhering to recommended goals of 1400-1600 calories and 85+ grams of protein daily.   Behavioral modification strategies: increasing lean protein intake and meal planning and cooking strategies.  Christina Santos has agreed to follow-up with our clinic in 3 weeks. She was informed of the importance of frequent follow-up visits to maximize her success with intensive lifestyle modifications for her multiple health conditions.   Objective:   Blood pressure 135/82, pulse 78, temperature 98.2 F (36.8 C), height 4\' 11"  (1.499 m), weight 219 lb (99.3 kg), last menstrual period 04/25/2021, SpO2 96 %. Body mass index is 44.23 kg/m.  General: Cooperative, alert, well developed, in no acute distress. HEENT: Conjunctivae and lids unremarkable. Cardiovascular: Regular rhythm.  Lungs: Normal work of breathing. Neurologic: No focal deficits.   Lab Results  Component Value Date   CREATININE 0.49 (L) 03/13/2021   BUN 8 03/13/2021   NA 139 03/13/2021   K 4.1 03/13/2021   CL 100 03/13/2021   CO2 25 03/13/2021   Lab Results  Component Value Date   ALT 11 03/13/2021   AST 14 03/13/2021   ALKPHOS 99 03/13/2021   BILITOT 0.3 03/13/2021   Lab Results  Component Value Date   HGBA1C 5.7 (H) 03/13/2021   HGBA1C 5.9 (H) 08/31/2020   Lab Results  Component Value Date   INSULIN 7.4 03/13/2021   Lab Results  Component Value Date   TSH 1.320 03/13/2021   Lab Results  Component Value Date   CHOL 180 03/13/2021  HDL 44 03/13/2021   LDLCALC 113 (H) 03/13/2021   TRIG 126 03/13/2021   Lab Results  Component Value Date   VD25OH 18.7 (L) 03/13/2021   Lab Results  Component Value Date   WBC 8.5 03/13/2021   HGB 11.4 03/13/2021   HCT 36.5 03/13/2021   MCV 78 (L) 03/13/2021   PLT 297 06/29/2020   No results found for: IRON, TIBC, FERRITIN  Attestation Statements:   Reviewed by clinician on day of visit:  allergies, medications, problem list, medical history, surgical history, family history, social history, and previous encounter notes.   I, Burt Knack, am acting as transcriptionist for Quillian Quince, MD.  I have reviewed the above documentation for accuracy and completeness, and I agree with the above. -  Quillian Quince, MD

## 2021-05-14 DIAGNOSIS — F411 Generalized anxiety disorder: Secondary | ICD-10-CM | POA: Diagnosis not present

## 2021-05-22 ENCOUNTER — Ambulatory Visit (INDEPENDENT_AMBULATORY_CARE_PROVIDER_SITE_OTHER): Payer: Medicaid Other | Admitting: Family Medicine

## 2021-05-22 ENCOUNTER — Encounter (INDEPENDENT_AMBULATORY_CARE_PROVIDER_SITE_OTHER): Payer: Self-pay | Admitting: Family Medicine

## 2021-05-22 ENCOUNTER — Other Ambulatory Visit: Payer: Self-pay

## 2021-05-22 VITALS — BP 104/71 | HR 70 | Temp 97.9°F | Ht 59.0 in | Wt 220.0 lb

## 2021-05-22 DIAGNOSIS — E66813 Obesity, class 3: Secondary | ICD-10-CM

## 2021-05-22 DIAGNOSIS — E559 Vitamin D deficiency, unspecified: Secondary | ICD-10-CM

## 2021-05-22 DIAGNOSIS — R7303 Prediabetes: Secondary | ICD-10-CM | POA: Diagnosis not present

## 2021-05-22 MED ORDER — VITAMIN D (ERGOCALCIFEROL) 1.25 MG (50000 UNIT) PO CAPS: 50000.0000 [IU] | ORAL_CAPSULE | ORAL | 0 refills | Status: DC

## 2021-05-28 DIAGNOSIS — Z419 Encounter for procedure for purposes other than remedying health state, unspecified: Secondary | ICD-10-CM | POA: Diagnosis not present

## 2021-05-28 NOTE — Progress Notes (Signed)
Chief Complaint:   OBESITY Christina Santos is here to discuss her progress with her obesity treatment plan along with follow-up of her obesity related diagnoses. Christina Santos is on keeping a food journal and adhering to recommended goals of 1400-1600 calories and 85+ grams of protein daily and states she is following her eating plan approximately 70% of the time. Christina Santos states she is doing 0 minutes 0 times per week.  Today's visit was #: 5 Starting weight: 222 lbs Starting date: 03/13/2021 Today's weight: 220 lbs Today's date: 05/22/2021 Total lbs lost to date: 2 Total lbs lost since last in-office visit: 0  Interim History: Christina Santos continues to work on diet and weight loss, but she is becoming discouraged. She is weighing herself daily, which she was warned against and then she gets off track if she doesn't lose weight.  Subjective:   1. Vitamin D deficiency Christina Santos is stable on Vit D, but her level is not yet at goal.  2. Pre-diabetes Christina Santos is on metformin, and she is missing her second dose frequently as she is not in the habit of taking medications later in the day.  Assessment/Plan:   1. Vitamin D deficiency Low Vitamin D level contributes to fatigue and are associated with obesity, breast, and colon cancer. We will refill prescription Vitamin D for 1 month. Christina Santos will follow-up for routine testing of Vitamin D, at least 2-3 times per year to avoid over-replacement.  - Vitamin D, Ergocalciferol, (DRISDOL) 1.25 MG (50000 UNIT) CAPS capsule; Take 1 capsule (50,000 Units total) by mouth every 7 (seven) days.  Dispense: 4 capsule; Refill: 0  2. Pre-diabetes Christina Santos will continue metformin and will work on remembering to take her second dose. She will continue to work on weight loss, exercise, and decreasing simple carbohydrates to help decrease the risk of diabetes.   3. Obesity with current BMI 44.4 Christina Santos is currently in the action stage of change. As such, her goal is to continue with weight loss  efforts. She has agreed to keeping a food journal and adhering to recommended goals of 1400-1600 calories and 85+ grams of protein daily.   Christina Santos is to not weigh herself at home and instead concentrate on eating healthier.  Behavioral modification strategies: increasing lean protein intake, increasing water intake, and no skipping meals.  Christina Santos has agreed to follow-up with our clinic in 2 weeks. She was informed of the importance of frequent follow-up visits to maximize her success with intensive lifestyle modifications for her multiple health conditions.   Objective:   Blood pressure 104/71, pulse 70, temperature 97.9 F (36.6 C), height 4\' 11"  (1.499 m), weight 220 lb (99.8 kg), last menstrual period 04/25/2021, SpO2 96 %. Body mass index is 44.43 kg/m.  General: Cooperative, alert, well developed, in no acute distress. HEENT: Conjunctivae and lids unremarkable. Cardiovascular: Regular rhythm.  Lungs: Normal work of breathing. Neurologic: No focal deficits.   Lab Results  Component Value Date   CREATININE 0.49 (L) 03/13/2021   BUN 8 03/13/2021   NA 139 03/13/2021   K 4.1 03/13/2021   CL 100 03/13/2021   CO2 25 03/13/2021   Lab Results  Component Value Date   ALT 11 03/13/2021   AST 14 03/13/2021   ALKPHOS 99 03/13/2021   BILITOT 0.3 03/13/2021   Lab Results  Component Value Date   HGBA1C 5.7 (H) 03/13/2021   HGBA1C 5.9 (H) 08/31/2020   Lab Results  Component Value Date   INSULIN 7.4 03/13/2021   Lab  Results  Component Value Date   TSH 1.320 03/13/2021   Lab Results  Component Value Date   CHOL 180 03/13/2021   HDL 44 03/13/2021   LDLCALC 113 (H) 03/13/2021   TRIG 126 03/13/2021   Lab Results  Component Value Date   VD25OH 18.7 (L) 03/13/2021   Lab Results  Component Value Date   WBC 8.5 03/13/2021   HGB 11.4 03/13/2021   HCT 36.5 03/13/2021   MCV 78 (L) 03/13/2021   PLT 297 06/29/2020   No results found for: IRON, TIBC, FERRITIN  Attestation  Statements:   Reviewed by clinician on day of visit: allergies, medications, problem list, medical history, surgical history, family history, social history, and previous encounter notes.   I, Burt Knack, am acting as transcriptionist for Quillian Quince, MD.  I have reviewed the above documentation for accuracy and completeness, and I agree with the above. -  Quillian Quince, MD

## 2021-05-30 DIAGNOSIS — F411 Generalized anxiety disorder: Secondary | ICD-10-CM | POA: Diagnosis not present

## 2021-06-04 ENCOUNTER — Encounter (INDEPENDENT_AMBULATORY_CARE_PROVIDER_SITE_OTHER): Payer: Self-pay

## 2021-06-05 ENCOUNTER — Ambulatory Visit (INDEPENDENT_AMBULATORY_CARE_PROVIDER_SITE_OTHER): Payer: Medicaid Other | Admitting: Adult Health

## 2021-06-05 ENCOUNTER — Other Ambulatory Visit: Payer: Self-pay

## 2021-06-05 ENCOUNTER — Encounter (INDEPENDENT_AMBULATORY_CARE_PROVIDER_SITE_OTHER): Payer: Self-pay | Admitting: Adult Health

## 2021-06-05 VITALS — BP 111/75 | HR 75 | Temp 98.1°F | Ht 59.0 in | Wt 219.0 lb

## 2021-06-05 DIAGNOSIS — R7303 Prediabetes: Secondary | ICD-10-CM | POA: Diagnosis not present

## 2021-06-05 DIAGNOSIS — Z6841 Body Mass Index (BMI) 40.0 and over, adult: Secondary | ICD-10-CM

## 2021-06-05 DIAGNOSIS — E559 Vitamin D deficiency, unspecified: Secondary | ICD-10-CM | POA: Diagnosis not present

## 2021-06-06 NOTE — Progress Notes (Signed)
Chief Complaint:   OBESITY Christina Santos is here to discuss her progress with her obesity treatment plan along with follow-up of her obesity related diagnoses. Christina Santos is on keeping a food journal and adhering to recommended goals of 1400-1600 calories and 85+ grams of protein and states she is following her eating plan approximately 95% of the time. Christina Santos states she is not exercising regularly.  Today's visit was #: 6 Starting weight: 222 lbs Starting date: 03/13/2021 Today's weight: 219 lbs Today's date: 06/05/2021 Total lbs lost to date: 3 lbs Total lbs lost since last in-office visit: 1 lb  Interim History: [name] will achieve cal/protein goals 95% of the time and is tracking about 50% of her total intake.  She has only weighed herself for 1 day, then has been able to refrain from daily weighing - great.  Subjective:   1. Pre-diabetes Glynis has set an alarm in the afternoon to increase compliance of afternoon metformin 500 mg dose - great!  He mother has T2D.  She denies GI upset with metformin.  Lab Results  Component Value Date   HGBA1C 5.7 (H) 03/13/2021   Lab Results  Component Value Date   INSULIN 7.4 03/13/2021   2. Vitamin D deficiency Vitamin D level on 03/13/2021 - 18.7 - well below goal of 50.  She is currently taking prescription vitamin D 50,000 IU each week. She denies nausea, vomiting or muscle weakness.  Lab Results  Component Value Date   VD25OH 18.7 (L) 03/13/2021   Assessment/Plan:   1. Pre-diabetes Continue metformin 500 mg twice daily.   2. Vitamin D deficiency Continue vitamin D.  No need for refill today.  3. Obesity with current BMI 44.2  Arriyanna is currently in the action stage of change. As such, her goal is to continue with weight loss efforts. She has agreed to keeping a food journal and adhering to recommended goals of 1400-1600 calories and 85 grams of protein.   Exercise goals:  As is.  Behavioral modification strategies: increasing lean  protein intake, decreasing simple carbohydrates, meal planning and cooking strategies, keeping healthy foods in the home, planning for success, and keeping a strict food journal.  Christina Santos has agreed to follow-up with our clinic in 2 weeks. She was informed of the importance of frequent follow-up visits to maximize her success with intensive lifestyle modifications for her multiple health conditions.   Objective:   Blood pressure 111/75, pulse 75, temperature 98.1 F (36.7 C), height 4\' 11"  (1.499 m), weight 219 lb (99.3 kg), SpO2 98 %. Body mass index is 44.23 kg/m.  General: Cooperative, alert, well developed, in no acute distress. HEENT: Conjunctivae and lids unremarkable. Cardiovascular: Regular rhythm.  Lungs: Normal work of breathing. Neurologic: No focal deficits.   Lab Results  Component Value Date   CREATININE 0.49 (L) 03/13/2021   BUN 8 03/13/2021   NA 139 03/13/2021   K 4.1 03/13/2021   CL 100 03/13/2021   CO2 25 03/13/2021   Lab Results  Component Value Date   ALT 11 03/13/2021   AST 14 03/13/2021   ALKPHOS 99 03/13/2021   BILITOT 0.3 03/13/2021   Lab Results  Component Value Date   HGBA1C 5.7 (H) 03/13/2021   HGBA1C 5.9 (H) 08/31/2020   Lab Results  Component Value Date   INSULIN 7.4 03/13/2021   Lab Results  Component Value Date   TSH 1.320 03/13/2021   Lab Results  Component Value Date   CHOL 180 03/13/2021  HDL 44 03/13/2021   LDLCALC 113 (H) 03/13/2021   TRIG 126 03/13/2021   Lab Results  Component Value Date   VD25OH 18.7 (L) 03/13/2021   Lab Results  Component Value Date   WBC 8.5 03/13/2021   HGB 11.4 03/13/2021   HCT 36.5 03/13/2021   MCV 78 (L) 03/13/2021   PLT 297 06/29/2020   Attestation Statements:   Reviewed by clinician on day of visit: allergies, medications, problem list, medical history, surgical history, family history, social history, and previous encounter notes.  Time spent on visit including pre-visit chart review  and post-visit care and charting was 28 minutes.   I, Insurance claims handler, CMA, am acting as Energy manager for Christina Hamburger, NP.  I have reviewed the above documentation for accuracy and completeness, and I agree with the above. -  Sumaiyah Markert d. Sevastian Witczak, NP-C

## 2021-06-12 DIAGNOSIS — F411 Generalized anxiety disorder: Secondary | ICD-10-CM | POA: Diagnosis not present

## 2021-06-18 ENCOUNTER — Encounter (INDEPENDENT_AMBULATORY_CARE_PROVIDER_SITE_OTHER): Payer: Self-pay

## 2021-06-19 ENCOUNTER — Ambulatory Visit (INDEPENDENT_AMBULATORY_CARE_PROVIDER_SITE_OTHER): Payer: Medicaid Other | Admitting: Adult Health

## 2021-06-19 ENCOUNTER — Other Ambulatory Visit: Payer: Self-pay

## 2021-06-19 ENCOUNTER — Encounter (INDEPENDENT_AMBULATORY_CARE_PROVIDER_SITE_OTHER): Payer: Self-pay | Admitting: Adult Health

## 2021-06-19 VITALS — BP 106/68 | HR 69 | Temp 98.3°F | Ht 59.0 in | Wt 222.0 lb

## 2021-06-19 DIAGNOSIS — R7303 Prediabetes: Secondary | ICD-10-CM

## 2021-06-19 DIAGNOSIS — Z6841 Body Mass Index (BMI) 40.0 and over, adult: Secondary | ICD-10-CM

## 2021-06-19 DIAGNOSIS — E65 Localized adiposity: Secondary | ICD-10-CM | POA: Diagnosis not present

## 2021-06-19 DIAGNOSIS — F411 Generalized anxiety disorder: Secondary | ICD-10-CM | POA: Diagnosis not present

## 2021-06-19 MED ORDER — METFORMIN HCL 500 MG PO TABS: 500.0000 mg | ORAL_TABLET | Freq: Two times a day (BID) | ORAL | 0 refills | Status: AC

## 2021-06-20 DIAGNOSIS — E65 Localized adiposity: Secondary | ICD-10-CM | POA: Insufficient documentation

## 2021-06-20 NOTE — Progress Notes (Signed)
Chief Complaint:   OBESITY Christina Santos is here to discuss her progress with her obesity treatment plan along with follow-up of her obesity related diagnoses. Christina Santos is on keeping a food journal and adhering to recommended goals of 1400-1600 calories and 85 grams of protein and states she is following her eating plan approximately 20% of the time. Christina Santos states she is doing cardio and weights for 60 minutes 3 times per week.  Today's visit was #: 7 Starting weight: 222 lbs Starting date: 03/13/2021 Today's weight: 222 lbs Today's date: 06/19/2021 Total lbs lost to date: 0 Total lbs lost since last in-office visit: 0  Interim History: Christina Santos's husband was recently laid off at the end of July 2022.  He has secured a new job that starts July 03, 2021.  She and her husband have been working with Christina Santos and [employer] to supplement her income.   She will often forget to eat or will stress eat at times.  Subjective:   1. Pre-diabetes On 03/13/2021, A1c was 5.7 with normal BG and only slightly elevated insulin at 7.4.   She is on metformin 500 mg twice daily - tolerating well.  Lab Results  Component Value Date   HGBA1C 5.7 (H) 03/13/2021   Lab Results  Component Value Date   INSULIN 7.4 03/13/2021   2. Pendulous abdomen Christina Santos has 3 children, ages 27, 33, 2 (boy, girl, boy) - 3 cesarean sections.  She is concerned about her large abdomen and the pressure it places on her body.  Would like to discuss referral to Plastic Surgery.  Assessment/Plan:   1. Pre-diabetes Christina Santos will continue to work on weight loss, exercise, and decreasing simple carbohydrates to help decrease the risk of diabetes.   - Refill metFORMIN (GLUCOPHAGE) 500 MG tablet; Take 1 tablet (500 mg total) by mouth 2 (two) times daily.  Dispense: 60 tablet; Refill: 0  2. Pendulous abdomen Christina Santos information provided.  3. Obesity with current BMI 44.9  Christina Santos is currently in the action stage of change. As such, her goal is  to continue with weight loss efforts. She has agreed to keeping a food journal and adhering to recommended goals of 1400-1600 calories and 85 grams of protein.   Exercise goals:  As is.  Behavioral modification strategies: increasing lean protein intake, decreasing simple carbohydrates, meal planning and cooking strategies, keeping healthy foods in the home, better snacking choices, emotional eating strategies, and planning for success.  Christina Santos has agreed to follow-up with our clinic in 3 weeks. She was informed of the importance of frequent follow-up visits to maximize her success with intensive lifestyle modifications for her multiple health conditions.   Objective:   Blood pressure 106/68, pulse 69, temperature 98.3 F (36.8 C), height 4\' 11"  (1.499 m), weight 222 lb (100.7 kg), SpO2 98 %. Body mass index is 44.84 kg/m.  General: Cooperative, alert, well developed, in no acute distress. HEENT: Conjunctivae and lids unremarkable. Cardiovascular: Regular rhythm.  Lungs: Normal work of breathing. Neurologic: No focal deficits.   Lab Results  Component Value Date   CREATININE 0.49 (L) 03/13/2021   BUN 8 03/13/2021   NA 139 03/13/2021   K 4.1 03/13/2021   CL 100 03/13/2021   CO2 25 03/13/2021   Lab Results  Component Value Date   ALT 11 03/13/2021   AST 14 03/13/2021   ALKPHOS 99 03/13/2021   BILITOT 0.3 03/13/2021   Lab Results  Component Value Date   HGBA1C 5.7 (H)  03/13/2021   HGBA1C 5.9 (H) 08/31/2020   Lab Results  Component Value Date   INSULIN 7.4 03/13/2021   Lab Results  Component Value Date   TSH 1.320 03/13/2021   Lab Results  Component Value Date   CHOL 180 03/13/2021   HDL 44 03/13/2021   LDLCALC 113 (H) 03/13/2021   TRIG 126 03/13/2021   Lab Results  Component Value Date   VD25OH 18.7 (L) 03/13/2021   Lab Results  Component Value Date   WBC 8.5 03/13/2021   HGB 11.4 03/13/2021   HCT 36.5 03/13/2021   MCV 78 (L) 03/13/2021   PLT 297  06/29/2020   Attestation Statements:   Reviewed by clinician on day of visit: allergies, medications, problem list, medical history, surgical history, family history, social history, and previous encounter notes.  Time spent on visit including pre-visit chart review and post-visit care and charting was 30 minutes.   I, Insurance claims handler, CMA, am acting as Energy manager for William Hamburger, NP.  I have reviewed the above documentation for accuracy and completeness, and I agree with the above. -  Valissa Lyvers d. Navdeep Halt, NP-C

## 2021-06-26 DIAGNOSIS — F411 Generalized anxiety disorder: Secondary | ICD-10-CM | POA: Diagnosis not present

## 2021-06-28 ENCOUNTER — Other Ambulatory Visit (INDEPENDENT_AMBULATORY_CARE_PROVIDER_SITE_OTHER): Payer: Self-pay | Admitting: Family Medicine

## 2021-06-28 DIAGNOSIS — E559 Vitamin D deficiency, unspecified: Secondary | ICD-10-CM

## 2021-06-28 DIAGNOSIS — Z419 Encounter for procedure for purposes other than remedying health state, unspecified: Secondary | ICD-10-CM | POA: Diagnosis not present

## 2021-06-28 NOTE — Telephone Encounter (Signed)
LAST APPOINTMENT DATE: 06/19/21 NEXT APPOINTMENT DATE: 07/11/21   [id number] - [place], [place] - [address] [place] [address] Phone: [phone] Fax: [phone]  Patient is requesting a refill of the following medications: Pending Prescriptions:                       Disp   Refills   Vitamin D, Ergocalciferol, (DRISDOL) 1.25 *4 caps*0       Sig: TAKE 1 CAPSULE BY MOUTH EVERY 7 DAYS   Date last filled: 05/22/21 Previously prescribed by [facility]  Lab Results      Component                Value               Date                      HGBA1C                   5.7 (H)             03/13/2021                HGBA1C                   5.9 (H)             08/31/2020           Lab Results      Component                Value               Date                      LDLCALC                  113 (H)             03/13/2021                CREATININE               0.49 (L)            03/13/2021           Lab Results      Component                Value               Date                      VD25OH                   18.7 (L)            03/13/2021            BP Readings from Last 3 Encounters: 06/19/21 : 106/68 06/05/21 : 111/75 05/22/21 : 104/71

## 2021-07-10 DIAGNOSIS — F411 Generalized anxiety disorder: Secondary | ICD-10-CM | POA: Diagnosis not present

## 2021-07-11 ENCOUNTER — Ambulatory Visit (INDEPENDENT_AMBULATORY_CARE_PROVIDER_SITE_OTHER): Payer: Medicaid Other | Admitting: Adult Health

## 2021-07-11 NOTE — Progress Notes (Signed)
Chart reviewed, agree above plan ?

## 2021-07-17 DIAGNOSIS — F411 Generalized anxiety disorder: Secondary | ICD-10-CM | POA: Diagnosis not present

## 2021-07-18 DIAGNOSIS — F411 Generalized anxiety disorder: Secondary | ICD-10-CM | POA: Diagnosis not present

## 2021-07-24 DIAGNOSIS — F4310 Post-traumatic stress disorder, unspecified: Secondary | ICD-10-CM | POA: Diagnosis not present

## 2021-07-28 DIAGNOSIS — Z419 Encounter for procedure for purposes other than remedying health state, unspecified: Secondary | ICD-10-CM | POA: Diagnosis not present

## 2021-08-01 ENCOUNTER — Other Ambulatory Visit (INDEPENDENT_AMBULATORY_CARE_PROVIDER_SITE_OTHER): Payer: Self-pay | Admitting: Adult Health

## 2021-08-01 DIAGNOSIS — E559 Vitamin D deficiency, unspecified: Secondary | ICD-10-CM

## 2021-08-07 DIAGNOSIS — F411 Generalized anxiety disorder: Secondary | ICD-10-CM | POA: Diagnosis not present

## 2021-08-15 DIAGNOSIS — F515 Nightmare disorder: Secondary | ICD-10-CM | POA: Diagnosis not present

## 2021-08-21 DIAGNOSIS — F411 Generalized anxiety disorder: Secondary | ICD-10-CM | POA: Diagnosis not present

## 2021-08-27 DIAGNOSIS — F411 Generalized anxiety disorder: Secondary | ICD-10-CM | POA: Diagnosis not present

## 2021-08-28 DIAGNOSIS — Z419 Encounter for procedure for purposes other than remedying health state, unspecified: Secondary | ICD-10-CM | POA: Diagnosis not present

## 2021-09-04 DIAGNOSIS — F411 Generalized anxiety disorder: Secondary | ICD-10-CM | POA: Diagnosis not present

## 2021-09-16 DIAGNOSIS — R457 State of emotional shock and stress, unspecified: Secondary | ICD-10-CM | POA: Diagnosis not present

## 2021-09-16 DIAGNOSIS — Z743 Need for continuous supervision: Secondary | ICD-10-CM | POA: Diagnosis not present

## 2021-09-16 DIAGNOSIS — R0789 Other chest pain: Secondary | ICD-10-CM | POA: Diagnosis not present

## 2021-09-24 ENCOUNTER — Other Ambulatory Visit (INDEPENDENT_AMBULATORY_CARE_PROVIDER_SITE_OTHER): Payer: Self-pay | Admitting: Adult Health

## 2021-09-24 DIAGNOSIS — R7303 Prediabetes: Secondary | ICD-10-CM

## 2021-09-27 DIAGNOSIS — Z419 Encounter for procedure for purposes other than remedying health state, unspecified: Secondary | ICD-10-CM | POA: Diagnosis not present

## 2021-10-05 DIAGNOSIS — F411 Generalized anxiety disorder: Secondary | ICD-10-CM | POA: Diagnosis not present

## 2021-10-28 DIAGNOSIS — Z419 Encounter for procedure for purposes other than remedying health state, unspecified: Secondary | ICD-10-CM | POA: Diagnosis not present

## 2021-11-28 DIAGNOSIS — Z419 Encounter for procedure for purposes other than remedying health state, unspecified: Secondary | ICD-10-CM | POA: Diagnosis not present

## 2021-12-03 DIAGNOSIS — F410 Panic disorder [episodic paroxysmal anxiety] without agoraphobia: Secondary | ICD-10-CM | POA: Diagnosis not present

## 2021-12-03 DIAGNOSIS — Z23 Encounter for immunization: Secondary | ICD-10-CM | POA: Diagnosis not present

## 2021-12-03 DIAGNOSIS — T782XXD Anaphylactic shock, unspecified, subsequent encounter: Secondary | ICD-10-CM | POA: Diagnosis not present

## 2021-12-03 DIAGNOSIS — F411 Generalized anxiety disorder: Secondary | ICD-10-CM | POA: Diagnosis not present

## 2021-12-03 DIAGNOSIS — L509 Urticaria, unspecified: Secondary | ICD-10-CM | POA: Diagnosis not present

## 2021-12-03 DIAGNOSIS — Z0001 Encounter for general adult medical examination with abnormal findings: Secondary | ICD-10-CM | POA: Diagnosis not present

## 2021-12-06 DIAGNOSIS — F411 Generalized anxiety disorder: Secondary | ICD-10-CM | POA: Diagnosis not present

## 2021-12-26 DIAGNOSIS — Z419 Encounter for procedure for purposes other than remedying health state, unspecified: Secondary | ICD-10-CM | POA: Diagnosis not present

## 2021-12-26 DIAGNOSIS — J342 Deviated nasal septum: Secondary | ICD-10-CM | POA: Diagnosis not present

## 2021-12-26 DIAGNOSIS — J343 Hypertrophy of nasal turbinates: Secondary | ICD-10-CM | POA: Diagnosis not present

## 2021-12-28 ENCOUNTER — Emergency Department (HOSPITAL_BASED_OUTPATIENT_CLINIC_OR_DEPARTMENT_OTHER)
Admission: EM | Admit: 2021-12-28 | Discharge: 2021-12-28 | Disposition: A | Discharge status: Home / Self Care | Payer: Medicaid Other | Attending: Emergency Medicine | Admitting: Emergency Medicine

## 2021-12-28 ENCOUNTER — Encounter (HOSPITAL_BASED_OUTPATIENT_CLINIC_OR_DEPARTMENT_OTHER): Payer: Self-pay | Admitting: *Deleted

## 2021-12-28 ENCOUNTER — Other Ambulatory Visit: Payer: Self-pay

## 2021-12-28 ENCOUNTER — Emergency Department (HOSPITAL_BASED_OUTPATIENT_CLINIC_OR_DEPARTMENT_OTHER): Payer: Medicaid Other

## 2021-12-28 DIAGNOSIS — R110 Nausea: Secondary | ICD-10-CM | POA: Insufficient documentation

## 2021-12-28 DIAGNOSIS — Z20822 Contact with and (suspected) exposure to covid-19: Secondary | ICD-10-CM | POA: Insufficient documentation

## 2021-12-28 DIAGNOSIS — R0981 Nasal congestion: Secondary | ICD-10-CM | POA: Insufficient documentation

## 2021-12-28 DIAGNOSIS — R0602 Shortness of breath: Secondary | ICD-10-CM | POA: Diagnosis not present

## 2021-12-28 DIAGNOSIS — R600 Localized edema: Secondary | ICD-10-CM | POA: Diagnosis not present

## 2021-12-28 DIAGNOSIS — M791 Myalgia, unspecified site: Secondary | ICD-10-CM | POA: Diagnosis not present

## 2021-12-28 DIAGNOSIS — R5383 Other fatigue: Secondary | ICD-10-CM | POA: Diagnosis not present

## 2021-12-28 LAB — URINALYSIS, ROUTINE W REFLEX MICROSCOPIC
Bilirubin Urine: NEGATIVE
Glucose, UA: NEGATIVE mg/dL
Ketones, ur: NEGATIVE mg/dL
Leukocytes,Ua: NEGATIVE
Nitrite: NEGATIVE
Protein, ur: NEGATIVE mg/dL
Specific Gravity, Urine: 1.02 (ref 1.005–1.030)
pH: 7.5 (ref 5.0–8.0)

## 2021-12-28 LAB — CBC WITH DIFFERENTIAL/PLATELET
Abs Immature Granulocytes: 0.08 10*3/uL — ABNORMAL HIGH (ref 0.00–0.07)
Basophils Absolute: 0 10*3/uL (ref 0.0–0.1)
Basophils Relative: 0 %
Eosinophils Absolute: 0.3 10*3/uL (ref 0.0–0.5)
Eosinophils Relative: 2 %
HCT: 34.2 % — ABNORMAL LOW (ref 36.0–46.0)
Hemoglobin: 10.8 g/dL — ABNORMAL LOW (ref 12.0–15.0)
Immature Granulocytes: 1 %
Lymphocytes Relative: 30 %
Lymphs Abs: 3.8 10*3/uL (ref 0.7–4.0)
MCH: 24.4 pg — ABNORMAL LOW (ref 26.0–34.0)
MCHC: 31.6 g/dL (ref 30.0–36.0)
MCV: 77.2 fL — ABNORMAL LOW (ref 80.0–100.0)
Monocytes Absolute: 0.7 10*3/uL (ref 0.1–1.0)
Monocytes Relative: 6 %
Neutro Abs: 7.9 10*3/uL — ABNORMAL HIGH (ref 1.7–7.7)
Neutrophils Relative %: 61 %
Platelets: 292 10*3/uL (ref 150–400)
RBC: 4.43 MIL/uL (ref 3.87–5.11)
RDW: 15.4 % (ref 11.5–15.5)
WBC: 12.8 10*3/uL — ABNORMAL HIGH (ref 4.0–10.5)
nRBC: 0 % (ref 0.0–0.2)

## 2021-12-28 LAB — BRAIN NATRIURETIC PEPTIDE: B Natriuretic Peptide: 19.9 pg/mL (ref 0.0–100.0)

## 2021-12-28 LAB — URINALYSIS, MICROSCOPIC (REFLEX): WBC, UA: NONE SEEN WBC/hpf (ref 0–5)

## 2021-12-28 LAB — COMPREHENSIVE METABOLIC PANEL
ALT: 16 U/L (ref 0–44)
AST: 18 U/L (ref 15–41)
Albumin: 3.2 g/dL — ABNORMAL LOW (ref 3.5–5.0)
Alkaline Phosphatase: 69 U/L (ref 38–126)
Anion gap: 10 (ref 5–15)
BUN: 12 mg/dL (ref 6–20)
CO2: 26 mmol/L (ref 22–32)
Calcium: 8.5 mg/dL — ABNORMAL LOW (ref 8.9–10.3)
Chloride: 102 mmol/L (ref 98–111)
Creatinine, Ser: 0.62 mg/dL (ref 0.44–1.00)
GFR, Estimated: >60 mL/min (ref >60)
Glucose, Bld: 78 mg/dL (ref 70–99)
Potassium: 3.7 mmol/L (ref 3.5–5.1)
Sodium: 138 mmol/L (ref 135–145)
Total Bilirubin: 0.2 mg/dL — ABNORMAL LOW (ref 0.3–1.2)
Total Protein: 7.2 g/dL (ref 6.5–8.1)

## 2021-12-28 LAB — PREGNANCY, URINE: Preg Test, Ur: NEGATIVE

## 2021-12-28 LAB — RESP PANEL BY RT-PCR (FLU A&B, COVID) ARPGX2
Influenza A by PCR: NEGATIVE
Influenza B by PCR: NEGATIVE
SARS Coronavirus 2 by RT PCR: NEGATIVE

## 2021-12-28 LAB — LACTIC ACID, PLASMA: Lactic Acid, Venous: 1 mmol/L (ref 0.5–1.9)

## 2021-12-28 LAB — LIPASE, BLOOD: Lipase: 31 U/L (ref 11–51)

## 2021-12-28 LAB — TROPONIN I (HIGH SENSITIVITY): Troponin I (High Sensitivity): 2 ng/L (ref <18)

## 2021-12-28 LAB — CK: Total CK: 48 U/L (ref 38–234)

## 2021-12-28 LAB — AMMONIA: Ammonia: 19 umol/L (ref 9–35)

## 2021-12-28 NOTE — ED Notes (Signed)
Pt. Has multiple complaints about her numbness in her hands bilat that has been on going for 7 mths.  Pt. Also states she has had swelling in her feet x 7 mths and in her upper clavicle area for several weeks.   ? ?Pt. Reports she has an appt. On Monday for blood work to be drawn for Lupus and she just could not wait.  RN explained to the Pt. She needed to keep the Appt. For the blood work . ?

## 2021-12-28 NOTE — ED Notes (Signed)
Patient transported to X-ray 

## 2021-12-28 NOTE — Discharge Instructions (Signed)
Your work-up today did not show any critical abnormalities that would require admission or intervention at this time.  We had a long shared decision-making conversation and feel you are appropriate for discharge home but please go to the appointment in several days to discuss further management.  Please rest and stay hydrated.  If any symptoms change or worsen acutely, please return to the nearest emergency department.  ?

## 2021-12-28 NOTE — ED Triage Notes (Signed)
Numbness in her hands and swelling in her lower extremities for the past 7 months. Fatigue is getting worse.  ?

## 2021-12-28 NOTE — ED Provider Notes (Signed)
[facility] EMERGENCY DEPARTMENT Provider Note   CSN: [id number] Arrival date & time: 12/28/21  1653     History  Chief Complaint  Patient presents with   Numbness    Christina Santos is a 31 y.o. female.  The history is provided by the patient, medical records and the spouse. No language interpreter was used.  Illness Location:  Fatige all over Severity:  Severe Onset quality:  Gradual Duration: Months of symptoms but worse this week. Timing:  Constant Progression:  Unchanged Chronicity:  Chronic Associated symptoms: congestion, fatigue, myalgias, nausea and shortness of breath   Associated symptoms: no abdominal pain, no chest pain, no cough, no diarrhea, no fever, no headaches, no loss of consciousness, no rash, no rhinorrhea, no sore throat, no vomiting and no wheezing       Home Medications Prior to Admission medications   Medication Sig Start Date End Date Taking? Authorizing Provider  busPIRone (BUSPAR) 7.5 MG tablet Take 7.5 mg by mouth 2 (two) times daily.   Yes [provider]  venlafaxine XR (EFFEXOR-XR) 150 MG 24 hr capsule Take 150 mg by mouth daily with breakfast.   Yes [provider]  metFORMIN (GLUCOPHAGE) 500 MG tablet Take 1 tablet (500 mg total) by mouth 2 (two) times daily. 06/19/21   Danford, Orpha Bur D, NP  Vitamin D, Ergocalciferol, (DRISDOL) 1.25 MG (50000 UNIT) CAPS capsule TAKE 1 CAPSULE BY MOUTH EVERY 7 DAYS 06/28/21   Danford, Orpha Bur D, NP      Allergies    Topiramate and Penicillins    Review of Systems   Review of Systems  Constitutional:  Positive for fatigue. Negative for chills, diaphoresis and fever.  HENT:  Positive for congestion. Negative for rhinorrhea and sore throat.   Eyes:  Negative for photophobia and visual disturbance.  Respiratory:  Positive for shortness of breath. Negative for cough, chest tightness and wheezing.   Cardiovascular:  Positive for leg swelling. Negative for chest pain and palpitations.   Gastrointestinal:  Positive for nausea. Negative for abdominal distention, abdominal pain, constipation, diarrhea and vomiting.  Genitourinary:  Negative for dysuria, flank pain and frequency.  Musculoskeletal:  Positive for myalgias. Negative for back pain, neck pain and neck stiffness.  Skin:  Negative for rash and wound.  Neurological:  Negative for dizziness, seizures, loss of consciousness, syncope, speech difficulty, weakness, light-headedness, numbness and headaches.  Psychiatric/Behavioral:  Negative for agitation and confusion.   All other systems reviewed and are negative.  Physical Exam Updated Vital Signs BP 126/75    Pulse 76    Temp 98.2 F (36.8 C) (Oral)    Resp 18    Ht 4\' 11"  (1.499 m)    Wt 113.4 kg    LMP 12/21/2021    SpO2 98%    BMI 50.49 kg/m  Physical Exam Vitals and nursing note reviewed.  Constitutional:      General: She is not in acute distress.    Appearance: She is well-developed. She is not ill-appearing, toxic-appearing or diaphoretic.  HENT:     Head: Normocephalic and atraumatic.     Nose: No congestion or rhinorrhea.     Mouth/Throat:     Mouth: Mucous membranes are moist.     Pharynx: No oropharyngeal exudate or posterior oropharyngeal erythema.  Eyes:     Extraocular Movements: Extraocular movements intact.     Conjunctiva/sclera: Conjunctivae normal.     Pupils: Pupils are equal, round, and reactive to light.  Cardiovascular:  Rate and Rhythm: Normal rate and regular rhythm.     Pulses: Normal pulses.     Heart sounds: No murmur heard. Pulmonary:     Effort: Pulmonary effort is normal. No respiratory distress.     Breath sounds: Normal breath sounds. No wheezing, rhonchi or rales.  Chest:     Chest wall: No tenderness.  Abdominal:     General: Abdomen is flat.     Palpations: Abdomen is soft.     Tenderness: There is no abdominal tenderness. There is no right CVA tenderness, left CVA tenderness, guarding or rebound.  Musculoskeletal:         General: No swelling or tenderness.     Cervical back: Neck supple. No tenderness.     Right lower leg: Edema present.     Left lower leg: Edema present.  Skin:    General: Skin is warm and dry.     Capillary Refill: Capillary refill takes less than 2 seconds.     Findings: No erythema or rash.  Neurological:     General: No focal deficit present.     Mental Status: She is alert and oriented to person, place, and time.     Sensory: No sensory deficit.     Motor: No weakness.     Coordination: Coordination normal.  Psychiatric:        Mood and Affect: Mood normal.    ED Results / Procedures / Treatments   Labs (all labs ordered are listed, but only abnormal results are displayed) Labs Reviewed  CBC WITH DIFFERENTIAL/PLATELET - Abnormal; Notable for the following components:      Result Value   WBC 12.8 (*)    Hemoglobin 10.8 (*)    HCT 34.2 (*)    MCV 77.2 (*)    MCH 24.4 (*)    Neutro Abs 7.9 (*)    Abs Immature Granulocytes 0.08 (*)    All other components within normal limits  COMPREHENSIVE METABOLIC PANEL - Abnormal; Notable for the following components:   Calcium 8.5 (*)    Albumin 3.2 (*)    Total Bilirubin 0.2 (*)    All other components within normal limits  URINALYSIS, ROUTINE W REFLEX MICROSCOPIC - Abnormal; Notable for the following components:   APPearance TURBID (*)    Hgb urine dipstick SMALL (*)    All other components within normal limits  URINALYSIS, MICROSCOPIC (REFLEX) - Abnormal; Notable for the following components:   Bacteria, UA FEW (*)    All other components within normal limits  RESP PANEL BY RT-PCR (FLU A&B, COVID) ARPGX2  URINE CULTURE  LIPASE, BLOOD  LACTIC ACID, PLASMA  BRAIN NATRIURETIC PEPTIDE  AMMONIA  CK  PREGNANCY, URINE  TSH  TROPONIN I (HIGH SENSITIVITY)  TROPONIN I (HIGH SENSITIVITY)    EKG None  Radiology DG Chest 2 View  Result Date: 12/28/2021 CLINICAL DATA:  Fatigue.  Shortness of breath. EXAM: CHEST - 2  VIEW COMPARISON:  None. FINDINGS: The cardiomediastinal contours are normal. The lungs are clear. Pulmonary vasculature is normal. No consolidation, pleural effusion, or pneumothorax. No acute osseous abnormalities are seen. IMPRESSION: Negative radiographs of the chest. Electronically Signed   By: Narda RutherfordMelanie  Sanford M.D.   On: 12/28/2021 21:08    Procedures Procedures    Medications Ordered in ED Medications - No data to display  ED Course/ Medical Decision Making/ A&P  Medical Decision Making Amount and/or Complexity of Data Reviewed Labs: ordered. Radiology: ordered.    Gianelle Weisner is a 31 y.o. female with a past medical history significant for iron deficiency anemia, anxiety, depression, previous cholecystectomy, GERD bowel syndrome, obesity, and previous idiopathic intracranial hypertension who presents with fatigue, diffuse peripheral edema, myalgias, shortness of breath, and some congestion.  Patient reports that she is scheduled to see her doctor on Monday where she is getting a work-up for other inflammatory diseases including lupus but due to 1 week of worsening fatigue and peripheral edema, she is concerned that something else could be going on needing evaluation.  She is not describing any recurrent symptoms such as headache, vision changes, or concerns about her previous intracranial hypertension but she is concerned about the fatigue.  She says that she is having some shortness of breath at times but denies chest pain or palpitations.  She denies constipation or diarrhea.  Denies any dysuria or hematuria.  She denies any vaginal discharge, vaginal bleeding, or any pelvic symptoms.  Denies any neck pain or neck stiffness.  Reports diffuse soreness and myalgias with her edema all over.  Denies history of DVT or PE and denies any sudden onset of the shortness of breath or chest pain.  On exam, lungs were clear and chest was nontender.  Abdomen was nontender.   Normal bowel sounds.  Extremities had normal sensation, strength, and pulses in all extremities.  Symmetric smile.  Clear speech.  Neck was nontender and she had normal range of motion of her neck.  No evidence of nuchal rigidity.  No rashes seen on my exam.  Pupils symmetric and reactive normal extract movements.  No focal neurologic deficits.  Patient resting comfortably and vital signs are reassuring initially with no tachycardia, hypoxia, or fever.   Given the patient's worsening fatigue, peripheral edema all over, some shortness of breath, and vague symptoms, we agreed to get some screening work-up to look for significant egregious lab abnormalities, evidence of occult infection, or evidence of critical fluid overload.  If work-up is reassuring, anticipate we will add more information to her work-up next week with her PCP for these more acute on chronic symptoms.  Patient agrees with the screening work-up.  Given her lack of headache, focal neurologic deficits, or vision changes do not feel she needs lumbar puncture at this time and patient agrees.  Anticipate reassessment after work-up but will likely discharge home after work-up.   Work-up returned overall reassuring.  No emergent findings discovered and we went through all of the work-up together.  We feel she is safe for discharge to follow-up with her PCP this week.  Patient agrees and will rest and stay hydrated.  She noted questions or concerns and was discharged in good condition.         Final Clinical Impression(s) / ED Diagnoses Final diagnoses:  Fatigue, unspecified type    Rx / DC Orders ED Discharge Orders     None       Clinical Impression: 1. Fatigue, unspecified type     Disposition: Discharge  Condition: Good  I have discussed the results, Dx and Tx plan with the pt(& family if present). He/she/they expressed understanding and agree(s) with the plan. Discharge instructions discussed at great length. Strict  return precautions discussed and pt &/or family have verbalized understanding of the instructions. No further questions at time of discharge.    New Prescriptions   No medications on file    Follow  Up: Dois Davenport, MD [address] [place] [address] [place] [address] [phone]     [facility] [address] [id number] [place] [address] [phone]       Lashunta Frieden, Canary Brim, MD 12/28/21 [phone]

## 2021-12-29 LAB — TSH: TSH: 2.193 u[IU]/mL (ref 0.350–4.500)

## 2021-12-30 LAB — URINE CULTURE: Culture: 40000 — AB

## 2021-12-31 ENCOUNTER — Telehealth (HOSPITAL_BASED_OUTPATIENT_CLINIC_OR_DEPARTMENT_OTHER): Payer: Self-pay | Admitting: *Deleted

## 2021-12-31 DIAGNOSIS — F411 Generalized anxiety disorder: Secondary | ICD-10-CM | POA: Diagnosis not present

## 2021-12-31 DIAGNOSIS — D509 Iron deficiency anemia, unspecified: Secondary | ICD-10-CM | POA: Diagnosis not present

## 2021-12-31 DIAGNOSIS — N920 Excessive and frequent menstruation with regular cycle: Secondary | ICD-10-CM | POA: Diagnosis not present

## 2021-12-31 DIAGNOSIS — E559 Vitamin D deficiency, unspecified: Secondary | ICD-10-CM | POA: Diagnosis not present

## 2021-12-31 DIAGNOSIS — M797 Fibromyalgia: Secondary | ICD-10-CM | POA: Diagnosis not present

## 2021-12-31 DIAGNOSIS — Z0001 Encounter for general adult medical examination with abnormal findings: Secondary | ICD-10-CM | POA: Diagnosis not present

## 2021-12-31 DIAGNOSIS — K580 Irritable bowel syndrome with diarrhea: Secondary | ICD-10-CM | POA: Diagnosis not present

## 2021-12-31 DIAGNOSIS — M2550 Pain in unspecified joint: Secondary | ICD-10-CM | POA: Diagnosis not present

## 2021-12-31 NOTE — Telephone Encounter (Signed)
Post ED Visit - Positive Culture Follow-up ? ?Culture report reviewed by antimicrobial stewardship pharmacist: ?[facility] ?[]  , Enzo Bi.D. ?[]  [address], Pharm.D., BCPS AQ-ID ?[]  , Pharm.D., BCPS ?[]  Celedonio Miyamoto, .D., BCPS ?[]  [place], .D., BCPS, AAHIVP ?[]  Georgina Pillion, Pharm.D., BCPS, AAHIVP ?[]  [address], PharmD, BCPS ?[]  , PharmD, BCPS ?[]  [place], PharmD, BCPS ?[]  [address], PharmD ?[]  , PharmD, BCPS ?[x]  Estella Husk, PharmD ? ? [facility] ?[]  Lysle Pearl, PharmD ?[]  , PharmD ?[]  Phillips Climes, PharmD ?[]  , Rph ?[]  Agapito Games) , PharmD ?[]  Verlan Friends, PharmD ?[]  , PharmD ?[]  Mervyn Gay, PharmD ?[]  , PharmD ?[]  Gerrit Halls, PharmD ?[]  Gerri Spore, PharmD ?[]  , PharmD ?[]  Len Childs, PharmD ? ? ?Positive urine culture ?Likely asymptomatic bacteruria or skin flora.  no further patient follow-up is required at this time. ? ? ?12/31/2021, 10:47 AM ?  ?

## 2022-01-07 DIAGNOSIS — M545 Low back pain, unspecified: Secondary | ICD-10-CM | POA: Diagnosis not present

## 2022-01-07 DIAGNOSIS — K580 Irritable bowel syndrome with diarrhea: Secondary | ICD-10-CM | POA: Diagnosis not present

## 2022-01-07 DIAGNOSIS — E559 Vitamin D deficiency, unspecified: Secondary | ICD-10-CM | POA: Diagnosis not present

## 2022-01-07 DIAGNOSIS — M797 Fibromyalgia: Secondary | ICD-10-CM | POA: Diagnosis not present

## 2022-01-11 DIAGNOSIS — F411 Generalized anxiety disorder: Secondary | ICD-10-CM | POA: Diagnosis not present

## 2022-01-25 DIAGNOSIS — F411 Generalized anxiety disorder: Secondary | ICD-10-CM | POA: Diagnosis not present

## 2022-01-26 DIAGNOSIS — Z419 Encounter for procedure for purposes other than remedying health state, unspecified: Secondary | ICD-10-CM | POA: Diagnosis not present

## 2022-02-14 DIAGNOSIS — M545 Low back pain, unspecified: Secondary | ICD-10-CM | POA: Diagnosis not present

## 2022-02-14 DIAGNOSIS — Z6841 Body Mass Index (BMI) 40.0 and over, adult: Secondary | ICD-10-CM | POA: Diagnosis not present

## 2022-02-14 DIAGNOSIS — G5600 Carpal tunnel syndrome, unspecified upper limb: Secondary | ICD-10-CM | POA: Diagnosis not present

## 2022-02-14 DIAGNOSIS — N920 Excessive and frequent menstruation with regular cycle: Secondary | ICD-10-CM | POA: Diagnosis not present

## 2022-02-15 DIAGNOSIS — J343 Hypertrophy of nasal turbinates: Secondary | ICD-10-CM | POA: Diagnosis not present

## 2022-02-15 DIAGNOSIS — Z0489 Encounter for examination and observation for other specified reasons: Secondary | ICD-10-CM | POA: Diagnosis not present

## 2022-02-15 DIAGNOSIS — J342 Deviated nasal septum: Secondary | ICD-10-CM | POA: Diagnosis not present

## 2022-02-15 DIAGNOSIS — Z3202 Encounter for pregnancy test, result negative: Secondary | ICD-10-CM | POA: Diagnosis not present

## 2022-02-22 DIAGNOSIS — H1045 Other chronic allergic conjunctivitis: Secondary | ICD-10-CM | POA: Diagnosis not present

## 2022-02-22 DIAGNOSIS — T781XXD Other adverse food reactions, not elsewhere classified, subsequent encounter: Secondary | ICD-10-CM | POA: Diagnosis not present

## 2022-02-22 DIAGNOSIS — R052 Subacute cough: Secondary | ICD-10-CM | POA: Diagnosis not present

## 2022-02-22 DIAGNOSIS — J3089 Other allergic rhinitis: Secondary | ICD-10-CM | POA: Diagnosis not present

## 2022-02-25 DIAGNOSIS — Z419 Encounter for procedure for purposes other than remedying health state, unspecified: Secondary | ICD-10-CM | POA: Diagnosis not present

## 2022-02-28 DIAGNOSIS — Z0000 Encounter for general adult medical examination without abnormal findings: Secondary | ICD-10-CM | POA: Diagnosis not present

## 2022-03-15 DIAGNOSIS — D509 Iron deficiency anemia, unspecified: Secondary | ICD-10-CM | POA: Diagnosis not present

## 2022-03-15 DIAGNOSIS — M797 Fibromyalgia: Secondary | ICD-10-CM | POA: Diagnosis not present

## 2022-03-15 DIAGNOSIS — F332 Major depressive disorder, recurrent severe without psychotic features: Secondary | ICD-10-CM | POA: Diagnosis not present

## 2022-03-15 DIAGNOSIS — N920 Excessive and frequent menstruation with regular cycle: Secondary | ICD-10-CM | POA: Diagnosis not present

## 2022-03-26 DIAGNOSIS — F411 Generalized anxiety disorder: Secondary | ICD-10-CM | POA: Diagnosis not present

## 2022-03-28 DIAGNOSIS — Z419 Encounter for procedure for purposes other than remedying health state, unspecified: Secondary | ICD-10-CM | POA: Diagnosis not present

## 2022-04-09 DIAGNOSIS — F411 Generalized anxiety disorder: Secondary | ICD-10-CM | POA: Diagnosis not present

## 2022-04-09 DIAGNOSIS — G4700 Insomnia, unspecified: Secondary | ICD-10-CM | POA: Diagnosis not present

## 2022-04-09 DIAGNOSIS — Z6841 Body Mass Index (BMI) 40.0 and over, adult: Secondary | ICD-10-CM | POA: Diagnosis not present

## 2022-04-10 DIAGNOSIS — F411 Generalized anxiety disorder: Secondary | ICD-10-CM | POA: Diagnosis not present

## 2022-04-27 DIAGNOSIS — Z419 Encounter for procedure for purposes other than remedying health state, unspecified: Secondary | ICD-10-CM | POA: Diagnosis not present

## 2022-05-28 DIAGNOSIS — Z419 Encounter for procedure for purposes other than remedying health state, unspecified: Secondary | ICD-10-CM | POA: Diagnosis not present

## 2022-06-05 ENCOUNTER — Encounter (INDEPENDENT_AMBULATORY_CARE_PROVIDER_SITE_OTHER): Payer: Self-pay

## 2022-06-27 DIAGNOSIS — F411 Generalized anxiety disorder: Secondary | ICD-10-CM | POA: Diagnosis not present

## 2022-06-28 DIAGNOSIS — Z419 Encounter for procedure for purposes other than remedying health state, unspecified: Secondary | ICD-10-CM | POA: Diagnosis not present

## 2022-07-03 DIAGNOSIS — F411 Generalized anxiety disorder: Secondary | ICD-10-CM | POA: Diagnosis not present

## 2022-07-03 DIAGNOSIS — M797 Fibromyalgia: Secondary | ICD-10-CM | POA: Diagnosis not present

## 2022-07-03 DIAGNOSIS — M545 Low back pain, unspecified: Secondary | ICD-10-CM | POA: Diagnosis not present

## 2022-07-03 DIAGNOSIS — G4700 Insomnia, unspecified: Secondary | ICD-10-CM | POA: Diagnosis not present

## 2022-07-04 IMAGING — XA DG SPINAL PUNCT LUMBAR DIAG WITH FL CT GUIDANCE
1 series · 1 of 1 positions shown · non-contrast
Comparison: none

CLINICAL DATA: Suspected pseudotumor cerebri.

[Series 2: ortho adipose · 1 of 1 slices shown]
[im 1/1]
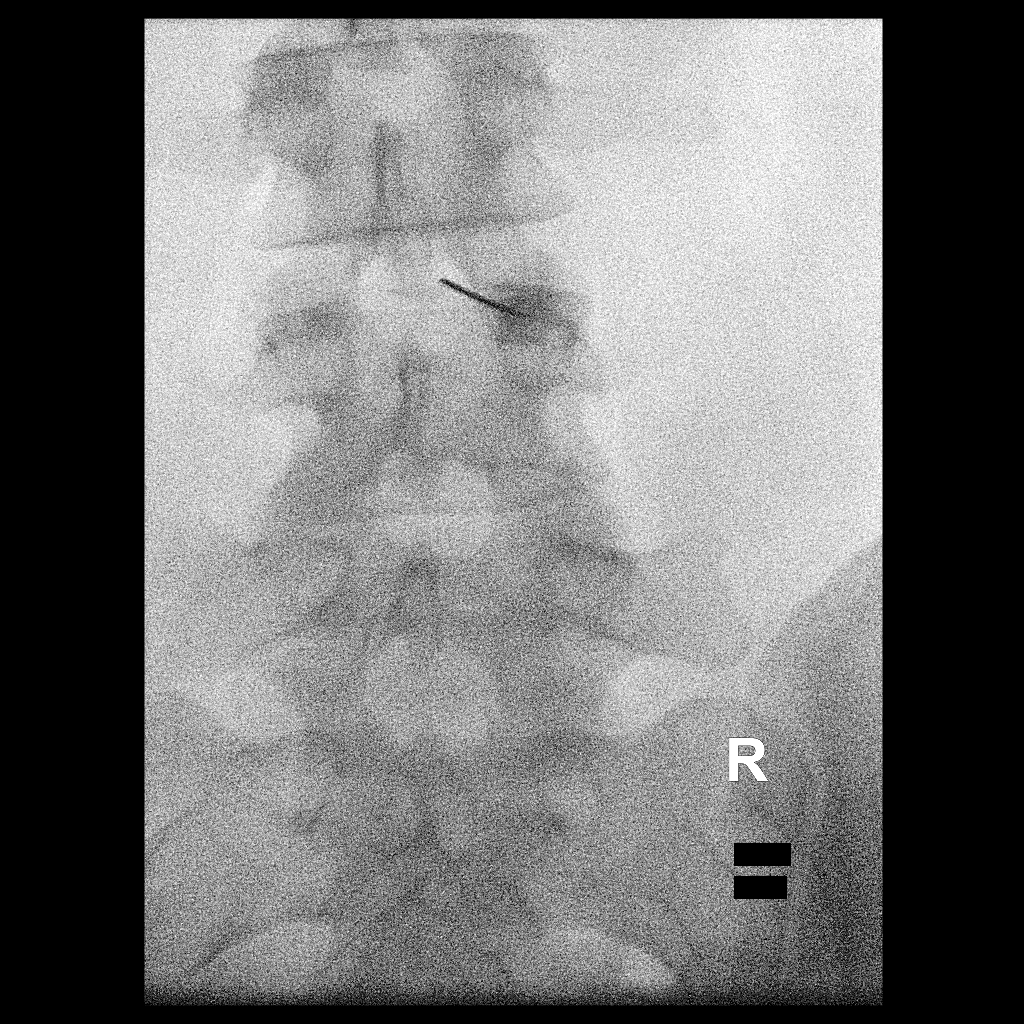

[1 of 1 positions shown; findings below may reference images not displayed]

EXAM:
DIAGNOSTIC LUMBAR PUNCTURE UNDER FLUOROSCOPIC GUIDANCE

FLUOROSCOPY TIME:  Fluoroscopy Time:  10 seconds

Radiation Exposure Index (if provided by the fluoroscopic device):
2.1 mGy

Number of Acquired Spot Images: 0

PROCEDURE:
Informed consent was obtained from the patient prior to the
procedure, including potential complications of headache, allergy,
and pain. With the patient prone, the lower back was prepped with
Betadine. 1% Lidocaine was used for local anesthesia. Lumbar
puncture was performed at the L3-L4 level via a right interlaminar
approach using a 3.5 inch 20 gauge needle with return of clear,
colorless CSF with an opening pressure of 26 cm water. 15 ml of CSF
were obtained for laboratory studies. Closing pressure was 11 cm
water. The patient tolerated the procedure well and there were no
apparent complications.
IMPRESSION: 1. Technically successful fluoroscopically guided lumbar puncture.
2. Elevated opening CSF pressure of 26 cm water, consistent with
idiopathic intracranial hypertension.

## 2022-07-23 DIAGNOSIS — F411 Generalized anxiety disorder: Secondary | ICD-10-CM | POA: Diagnosis not present

## 2022-07-28 DIAGNOSIS — Z419 Encounter for procedure for purposes other than remedying health state, unspecified: Secondary | ICD-10-CM | POA: Diagnosis not present

## 2022-08-05 DIAGNOSIS — M797 Fibromyalgia: Secondary | ICD-10-CM | POA: Diagnosis not present

## 2022-08-05 DIAGNOSIS — M1388 Other specified arthritis, other site: Secondary | ICD-10-CM | POA: Diagnosis not present

## 2022-08-05 DIAGNOSIS — M5416 Radiculopathy, lumbar region: Secondary | ICD-10-CM | POA: Diagnosis not present

## 2022-08-05 DIAGNOSIS — Z6841 Body Mass Index (BMI) 40.0 and over, adult: Secondary | ICD-10-CM | POA: Diagnosis not present

## 2022-08-06 DIAGNOSIS — B353 Tinea pedis: Secondary | ICD-10-CM | POA: Diagnosis not present

## 2022-08-06 DIAGNOSIS — R600 Localized edema: Secondary | ICD-10-CM | POA: Diagnosis not present

## 2022-08-06 DIAGNOSIS — L603 Nail dystrophy: Secondary | ICD-10-CM | POA: Diagnosis not present

## 2022-08-09 ENCOUNTER — Emergency Department (HOSPITAL_BASED_OUTPATIENT_CLINIC_OR_DEPARTMENT_OTHER): Payer: Medicaid Other

## 2022-08-09 ENCOUNTER — Emergency Department (HOSPITAL_BASED_OUTPATIENT_CLINIC_OR_DEPARTMENT_OTHER)
Admission: EM | Admit: 2022-08-09 | Discharge: 2022-08-10 | Disposition: A | Discharge status: Home / Self Care | Payer: Medicaid Other | Attending: Emergency Medicine | Admitting: Emergency Medicine

## 2022-08-09 ENCOUNTER — Encounter (HOSPITAL_BASED_OUTPATIENT_CLINIC_OR_DEPARTMENT_OTHER): Payer: Self-pay | Admitting: Emergency Medicine

## 2022-08-09 ENCOUNTER — Other Ambulatory Visit: Payer: Self-pay

## 2022-08-09 DIAGNOSIS — R109 Unspecified abdominal pain: Secondary | ICD-10-CM | POA: Insufficient documentation

## 2022-08-09 DIAGNOSIS — M5441 Lumbago with sciatica, right side: Secondary | ICD-10-CM | POA: Diagnosis not present

## 2022-08-09 DIAGNOSIS — M545 Low back pain, unspecified: Secondary | ICD-10-CM | POA: Diagnosis present

## 2022-08-09 LAB — URINALYSIS, ROUTINE W REFLEX MICROSCOPIC
Bilirubin Urine: NEGATIVE
Glucose, UA: NEGATIVE mg/dL
Ketones, ur: NEGATIVE mg/dL
Nitrite: NEGATIVE
RBC / HPF: >50 RBC/hpf — ABNORMAL HIGH (ref 0–5)
Specific Gravity, Urine: 1.018 (ref 1.005–1.030)
pH: 7.5 (ref 5.0–8.0)

## 2022-08-09 LAB — PREGNANCY, URINE: Preg Test, Ur: NEGATIVE

## 2022-08-09 MED ORDER — KETOROLAC TROMETHAMINE 30 MG/ML IJ SOLN
30.0000 mg | Freq: Once | INTRAMUSCULAR | Status: AC
  Administered 2022-08-09: 30 mg via INTRAMUSCULAR
  Filled 2022-08-09: qty 1

## 2022-08-09 NOTE — ED Triage Notes (Signed)
Lower back pain since Monday, was seen by PCP for this and given a muscle relaxer and NSAID and increased dose of gabapentin.  Pain is intermittent, worse with movement.   Presents today because along with back pain Christina Santos is also now having a tingling sensation and edema in bilateral arms and legs since this morning.  Christina Santos is ambulatory into triage. Denies urinary sx, fever, chill, N/V/D, CP, SOB.

## 2022-08-09 NOTE — ED Provider Notes (Signed)
Emergency Department Provider Note   I have reviewed the triage vital signs and the nursing notes.   HISTORY  Chief Complaint Back Pain   HPI Christina Santos is a 31 y.o. female with PMH reviewed below presents to the emergency department for evaluation of lower back pain.  Patient states she was lifting groceries and felt a sudden pain in her lower back, more on the right side.  She saw her primary care physician last week and was given a muscle relaxer along with NSAID and increase the dose of gabapentin.  Pain is worse with movement and is noticed some tingling in the hands/feet along with radiation of pain mainly down the right leg.  No bowel or bladder incontinence.  No saddle anesthesia.  No urinary retention.  No fevers. No IVDA. She is on her stool cycle.    Past Medical History:  Diagnosis Date   ADD (attention deficit disorder)    Anxiety and depression    Back pain    Eczema    Fatty liver    Gallbladder problem    Headache    IBS (irritable bowel syndrome)    Iron deficiency anemia    Kidney problem    Low iron    Menorrhagia    Obesity    PONV (postoperative nausea and vomiting)    Pre-diabetes    Pregnant    PTSD (post-traumatic stress disorder)    Swelling of both lower extremities    Vitamin D deficiency     Review of Systems  Constitutional: No fever/chills Cardiovascular: Denies chest pain. Respiratory: Denies shortness of breath. Gastrointestinal: No abdominal pain.  No nausea, no vomiting.  No diarrhea.  No constipation. Genitourinary: Negative for dysuria. Musculoskeletal: Positive for back pain and right leg pain.  Skin: Negative for rash. Neurological: Negative for headaches, focal weakness or numbness.  ____________________________________________   PHYSICAL EXAM:  VITAL SIGNS: ED Triage Vitals  Enc Vitals Group     BP 08/09/22 2031 139/81     Pulse Rate 08/09/22 2031 93     Resp 08/09/22 2031 18     Temp 08/09/22 2031 98.6 F (37  C)     Temp Source 08/09/22 2031 Oral     SpO2 08/09/22 2031 100 %     Weight 08/09/22 2036 250 lb (113.4 kg)     Height 08/09/22 2036 4\' 11"  (1.499 m)   Constitutional: Alert and oriented. Well appearing and in no acute distress. Eyes: Conjunctivae are normal.  Head: Atraumatic. Nose: No congestion/rhinnorhea. Mouth/Throat: Mucous membranes are moist.   Neck: No stridor.  Cardiovascular: Normal rate, regular rhythm. Good peripheral circulation. Grossly normal heart sounds.   Respiratory: Normal respiratory effort.  No retractions. Lungs CTAB. Gastrointestinal: Soft and nontender. No distention.  Musculoskeletal: No lower extremity tenderness nor edema. No gross deformities of extremities. No midline thoracic or lumbar spine tenderness.  Neurologic:  Normal speech and language. No gross focal neurologic deficits are appreciated.  Skin:  Skin is warm, dry and intact. No rash noted.  ____________________________________________   LABS (all labs ordered are listed, but only abnormal results are displayed)  Labs Reviewed  URINALYSIS, ROUTINE W REFLEX MICROSCOPIC - Abnormal; Notable for the following components:      Result Value   Color, Urine ORANGE (*)    APPearance HAZY (*)    Hgb urine dipstick LARGE (*)    Protein, ur TRACE (*)    Leukocytes,Ua SMALL (*)    RBC / HPF >50 (*)  Bacteria, UA RARE (*)    All other components within normal limits  PREGNANCY, URINE   ____________________________________________  RADIOLOGY  No results found.  ____________________________________________   PROCEDURES  Procedure(s) performed:   Procedures   ____________________________________________   INITIAL IMPRESSION / ASSESSMENT AND PLAN / ED COURSE  Pertinent labs & imaging results that were available during my care of the patient were reviewed by me and considered in my medical decision making (see chart for details).   This patient is Presenting for Evaluation of back  pain, which does require a range of treatment options, and is a complaint that involves a high risk of morbidity and mortality.  The Differential Diagnoses includes but is not exclusive to musculoskeletal back pain, renal colic, urinary tract infection, pyelonephritis, intra-abdominal causes of back pain, aortic aneurysm or dissection, cauda equina syndrome, sciatica, lumbar disc disease, thoracic disc disease, etc.   Critical Interventions-    Medications  ketorolac (TORADOL) 30 MG/ML injection 30 mg (30 mg Intramuscular Given 08/09/22 2317)    Reassessment after intervention:     I did obtain Additional Historical Information from SO at bedside.   Clinical Laboratory Tests Ordered, included UA with RBC consistent with menstrual cycle.   Radiologic Tests Ordered, included ***. I independently interpreted the images and agree with radiology interpretation.   Cardiac Monitor Tracing which shows NSR.   Social Determinants of Health Risk patient is a non-smoker.   Consult complete with  Medical Decision Making: Summary:  Patient presents emergency department for evaluation of lower back pain worse on the right.  The distribution and symptomatology seem most consistent with sciatica.  2+ patellar reflexes, normal sensation in the bilateral upper and lower extremities, and no red flag signs or symptoms to prompt emergent MRI.  Plan for CT renal with her hematuria but suspect that this is related to her starting her menstrual cycle rather than ureteral stone.  Reevaluation with update and discussion with   ***Considered admission***  Disposition:   ____________________________________________  FINAL CLINICAL IMPRESSION(S) / ED DIAGNOSES  Final diagnoses:  None     NEW OUTPATIENT MEDICATIONS STARTED DURING THIS VISIT:  New Prescriptions   No medications on file    Note:  This document was prepared using Dragon voice recognition software and may include unintentional  dictation errors.  Nanda Quinton, MD, [facility]

## 2022-08-10 ENCOUNTER — Emergency Department (HOSPITAL_BASED_OUTPATIENT_CLINIC_OR_DEPARTMENT_OTHER): Payer: Medicaid Other

## 2022-08-10 DIAGNOSIS — N200 Calculus of kidney: Secondary | ICD-10-CM | POA: Diagnosis not present

## 2022-08-10 MED ORDER — DEXAMETHASONE SODIUM PHOSPHATE 10 MG/ML IJ SOLN: 10.0000 mg | Freq: Once | INTRAMUSCULAR | Status: DC

## 2022-08-10 MED ORDER — METHOCARBAMOL 500 MG PO TABS: 500.0000 mg | ORAL_TABLET | Freq: Two times a day (BID) | ORAL | 0 refills | Status: AC

## 2022-08-10 MED ORDER — DICLOFENAC SODIUM 1 % EX GEL: 2.0000 g | Freq: Four times a day (QID) | CUTANEOUS | 0 refills | Status: AC

## 2022-08-10 MED ORDER — DEXAMETHASONE SODIUM PHOSPHATE 10 MG/ML IJ SOLN
10.0000 mg | Freq: Once | INTRAMUSCULAR | Status: AC
  Administered 2022-08-10: 10 mg via INTRAMUSCULAR
  Filled 2022-08-10: qty 1

## 2022-08-10 NOTE — Discharge Instructions (Signed)

## 2022-08-16 DIAGNOSIS — H5213 Myopia, bilateral: Secondary | ICD-10-CM | POA: Diagnosis not present

## 2022-08-20 DIAGNOSIS — F411 Generalized anxiety disorder: Secondary | ICD-10-CM | POA: Diagnosis not present

## 2022-08-28 DIAGNOSIS — Z419 Encounter for procedure for purposes other than remedying health state, unspecified: Secondary | ICD-10-CM | POA: Diagnosis not present

## 2022-09-17 DIAGNOSIS — M5416 Radiculopathy, lumbar region: Secondary | ICD-10-CM | POA: Diagnosis not present

## 2022-09-17 DIAGNOSIS — R7301 Impaired fasting glucose: Secondary | ICD-10-CM | POA: Diagnosis not present

## 2022-09-17 DIAGNOSIS — M797 Fibromyalgia: Secondary | ICD-10-CM | POA: Diagnosis not present

## 2022-09-17 DIAGNOSIS — E559 Vitamin D deficiency, unspecified: Secondary | ICD-10-CM | POA: Diagnosis not present

## 2022-09-27 DIAGNOSIS — Z419 Encounter for procedure for purposes other than remedying health state, unspecified: Secondary | ICD-10-CM | POA: Diagnosis not present

## 2022-10-28 DIAGNOSIS — Z419 Encounter for procedure for purposes other than remedying health state, unspecified: Secondary | ICD-10-CM | POA: Diagnosis not present

## 2022-10-31 DIAGNOSIS — Z5320 Procedure and treatment not carried out because of patient's decision for unspecified reasons: Secondary | ICD-10-CM | POA: Diagnosis not present

## 2022-10-31 DIAGNOSIS — K625 Hemorrhage of anus and rectum: Secondary | ICD-10-CM | POA: Diagnosis not present

## 2022-10-31 DIAGNOSIS — Z880 Allergy status to penicillin: Secondary | ICD-10-CM | POA: Diagnosis not present

## 2022-10-31 DIAGNOSIS — R1084 Generalized abdominal pain: Secondary | ICD-10-CM | POA: Diagnosis not present

## 2022-11-01 DIAGNOSIS — N200 Calculus of kidney: Secondary | ICD-10-CM | POA: Diagnosis not present

## 2022-11-01 DIAGNOSIS — K5900 Constipation, unspecified: Secondary | ICD-10-CM | POA: Diagnosis not present

## 2022-11-01 DIAGNOSIS — R109 Unspecified abdominal pain: Secondary | ICD-10-CM | POA: Diagnosis not present

## 2022-11-01 DIAGNOSIS — K922 Gastrointestinal hemorrhage, unspecified: Secondary | ICD-10-CM | POA: Diagnosis not present

## 2022-11-01 DIAGNOSIS — R404 Transient alteration of awareness: Secondary | ICD-10-CM | POA: Diagnosis not present

## 2022-11-01 DIAGNOSIS — R42 Dizziness and giddiness: Secondary | ICD-10-CM | POA: Diagnosis not present

## 2022-11-01 DIAGNOSIS — D509 Iron deficiency anemia, unspecified: Secondary | ICD-10-CM | POA: Diagnosis not present

## 2022-11-04 DIAGNOSIS — R194 Change in bowel habit: Secondary | ICD-10-CM | POA: Diagnosis not present

## 2022-11-04 DIAGNOSIS — I728 Aneurysm of other specified arteries: Secondary | ICD-10-CM | POA: Diagnosis not present

## 2022-11-04 DIAGNOSIS — Z6841 Body Mass Index (BMI) 40.0 and over, adult: Secondary | ICD-10-CM | POA: Diagnosis not present

## 2022-11-04 DIAGNOSIS — D649 Anemia, unspecified: Secondary | ICD-10-CM | POA: Diagnosis not present

## 2022-11-04 DIAGNOSIS — K921 Melena: Secondary | ICD-10-CM | POA: Diagnosis not present

## 2022-11-04 DIAGNOSIS — R1011 Right upper quadrant pain: Secondary | ICD-10-CM | POA: Diagnosis not present

## 2022-11-12 DIAGNOSIS — K921 Melena: Secondary | ICD-10-CM | POA: Diagnosis not present

## 2022-11-12 DIAGNOSIS — K648 Other hemorrhoids: Secondary | ICD-10-CM | POA: Diagnosis not present

## 2022-11-12 DIAGNOSIS — R194 Change in bowel habit: Secondary | ICD-10-CM | POA: Diagnosis not present

## 2022-11-12 DIAGNOSIS — D509 Iron deficiency anemia, unspecified: Secondary | ICD-10-CM | POA: Diagnosis not present

## 2022-11-12 DIAGNOSIS — R1011 Right upper quadrant pain: Secondary | ICD-10-CM | POA: Diagnosis not present

## 2022-11-12 DIAGNOSIS — D649 Anemia, unspecified: Secondary | ICD-10-CM | POA: Diagnosis not present

## 2022-11-14 ENCOUNTER — Institutional Professional Consult (permissible substitution): Payer: Medicaid Other | Admitting: Plastic Surgery

## 2022-11-14 DIAGNOSIS — F411 Generalized anxiety disorder: Secondary | ICD-10-CM | POA: Diagnosis not present

## 2022-11-18 ENCOUNTER — Institutional Professional Consult (permissible substitution): Payer: Medicaid Other | Admitting: Plastic Surgery

## 2022-11-22 DIAGNOSIS — M797 Fibromyalgia: Secondary | ICD-10-CM | POA: Diagnosis not present

## 2022-11-22 DIAGNOSIS — U071 COVID-19: Secondary | ICD-10-CM | POA: Diagnosis not present

## 2022-11-22 DIAGNOSIS — J029 Acute pharyngitis, unspecified: Secondary | ICD-10-CM | POA: Diagnosis not present

## 2022-11-22 DIAGNOSIS — Z20822 Contact with and (suspected) exposure to covid-19: Secondary | ICD-10-CM | POA: Diagnosis not present

## 2022-11-22 DIAGNOSIS — R051 Acute cough: Secondary | ICD-10-CM | POA: Diagnosis not present

## 2022-11-28 DIAGNOSIS — Z419 Encounter for procedure for purposes other than remedying health state, unspecified: Secondary | ICD-10-CM | POA: Diagnosis not present

## 2022-12-27 DIAGNOSIS — Z419 Encounter for procedure for purposes other than remedying health state, unspecified: Secondary | ICD-10-CM | POA: Diagnosis not present

## 2023-01-27 DIAGNOSIS — Z419 Encounter for procedure for purposes other than remedying health state, unspecified: Secondary | ICD-10-CM | POA: Diagnosis not present

## 2023-02-26 DIAGNOSIS — Z419 Encounter for procedure for purposes other than remedying health state, unspecified: Secondary | ICD-10-CM | POA: Diagnosis not present

## 2023-03-21 DIAGNOSIS — F411 Generalized anxiety disorder: Secondary | ICD-10-CM | POA: Diagnosis not present

## 2023-03-29 DIAGNOSIS — Z419 Encounter for procedure for purposes other than remedying health state, unspecified: Secondary | ICD-10-CM | POA: Diagnosis not present

## 2023-04-24 DIAGNOSIS — R635 Abnormal weight gain: Secondary | ICD-10-CM | POA: Diagnosis not present

## 2023-04-24 DIAGNOSIS — Z0000 Encounter for general adult medical examination without abnormal findings: Secondary | ICD-10-CM | POA: Diagnosis not present

## 2023-04-28 DIAGNOSIS — Z419 Encounter for procedure for purposes other than remedying health state, unspecified: Secondary | ICD-10-CM | POA: Diagnosis not present

## 2023-05-23 DIAGNOSIS — R102 Pelvic and perineal pain: Secondary | ICD-10-CM | POA: Diagnosis not present

## 2023-05-29 DIAGNOSIS — Z419 Encounter for procedure for purposes other than remedying health state, unspecified: Secondary | ICD-10-CM | POA: Diagnosis not present

## 2023-06-26 DIAGNOSIS — R946 Abnormal results of thyroid function studies: Secondary | ICD-10-CM | POA: Diagnosis not present

## 2023-06-29 DIAGNOSIS — Z419 Encounter for procedure for purposes other than remedying health state, unspecified: Secondary | ICD-10-CM | POA: Diagnosis not present

## 2023-07-29 DIAGNOSIS — Z419 Encounter for procedure for purposes other than remedying health state, unspecified: Secondary | ICD-10-CM | POA: Diagnosis not present

## 2023-07-31 DIAGNOSIS — S90852A Superficial foreign body, left foot, initial encounter: Secondary | ICD-10-CM | POA: Diagnosis not present

## 2023-07-31 DIAGNOSIS — Q66212 Congenital metatarsus primus varus, left foot: Secondary | ICD-10-CM | POA: Diagnosis not present

## 2023-07-31 DIAGNOSIS — M79672 Pain in left foot: Secondary | ICD-10-CM | POA: Diagnosis not present

## 2023-08-05 DIAGNOSIS — L84 Corns and callosities: Secondary | ICD-10-CM | POA: Diagnosis not present

## 2023-08-05 DIAGNOSIS — S90852A Superficial foreign body, left foot, initial encounter: Secondary | ICD-10-CM | POA: Diagnosis not present

## 2023-08-29 DIAGNOSIS — Z419 Encounter for procedure for purposes other than remedying health state, unspecified: Secondary | ICD-10-CM | POA: Diagnosis not present

## 2023-08-31 IMAGING — CR DG CHEST 2V
2 series · 2 of 2 positions shown · non-contrast
Comparison: None.

CLINICAL DATA: Fatigue.  Shortness of breath.

EXAM:
CHEST - 2 VIEW

[w chest pa *]
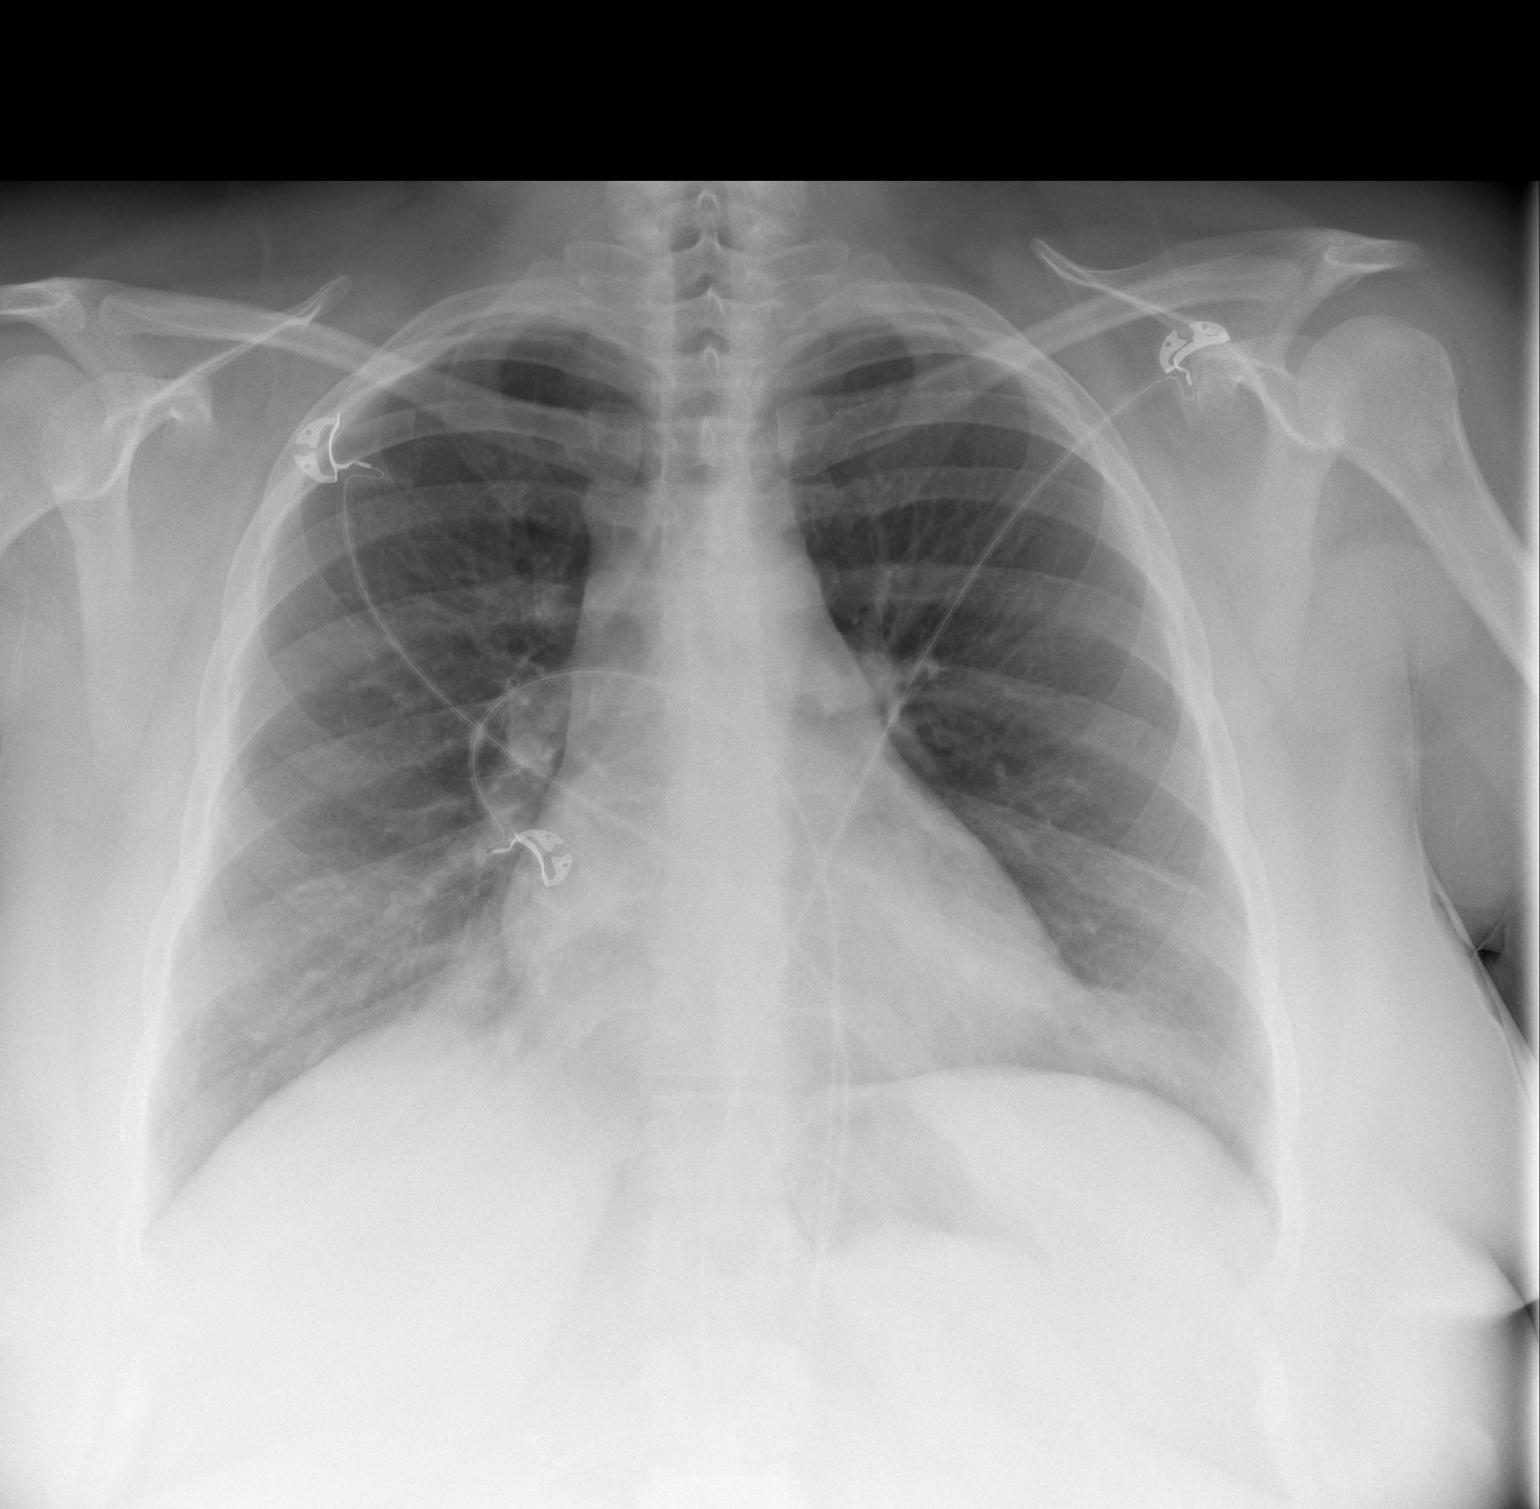

[w chest lat *]
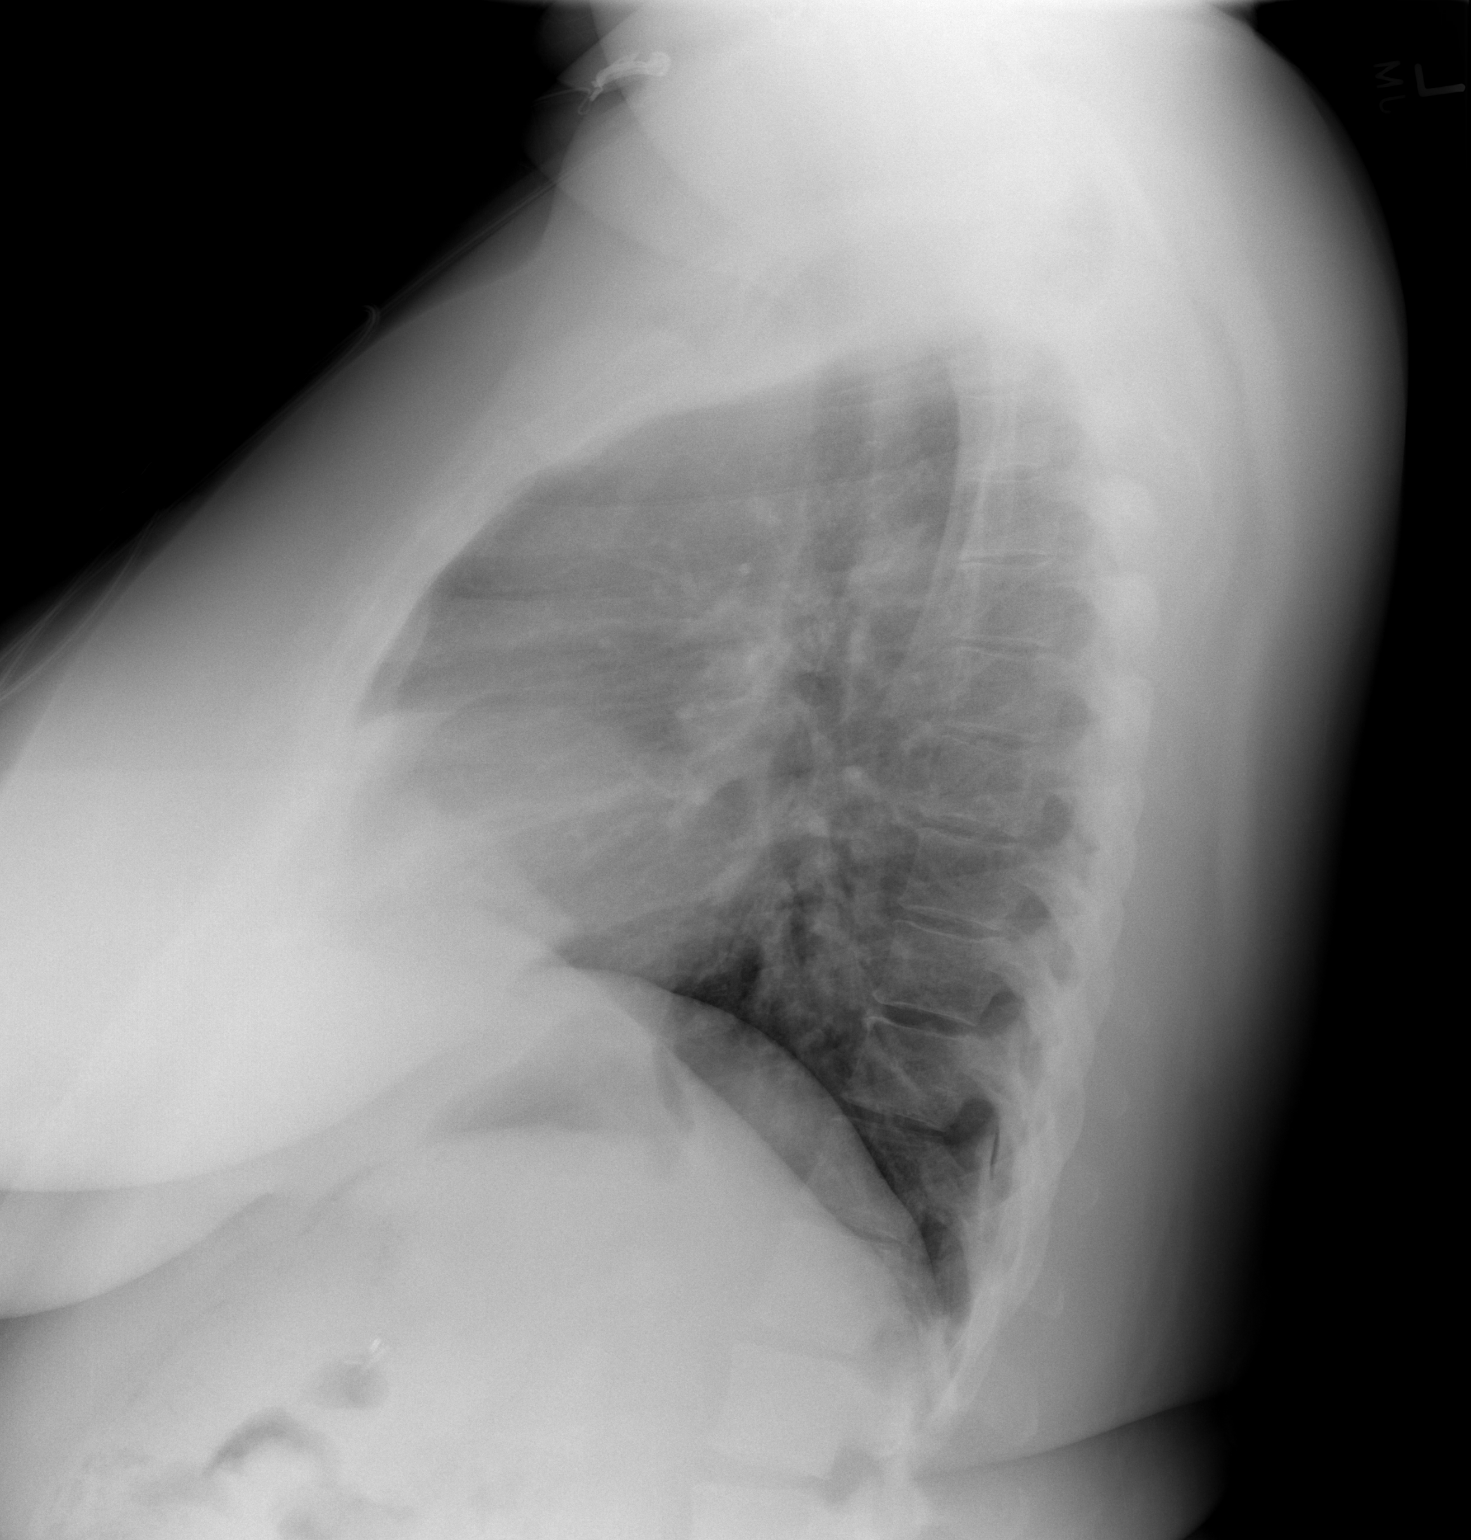

[2 of 2 positions shown; findings below may reference images not displayed]

FINDINGS: The cardiomediastinal contours are normal. The lungs are clear.
Pulmonary vasculature is normal. No consolidation, pleural effusion,
or pneumothorax. No acute osseous abnormalities are seen.
IMPRESSION: Negative radiographs of the chest.

## 2023-09-17 DIAGNOSIS — F411 Generalized anxiety disorder: Secondary | ICD-10-CM | POA: Diagnosis not present

## 2023-09-28 DIAGNOSIS — Z419 Encounter for procedure for purposes other than remedying health state, unspecified: Secondary | ICD-10-CM | POA: Diagnosis not present

## 2023-10-17 DIAGNOSIS — B372 Candidiasis of skin and nail: Secondary | ICD-10-CM | POA: Diagnosis not present

## 2023-10-17 DIAGNOSIS — L732 Hidradenitis suppurativa: Secondary | ICD-10-CM | POA: Diagnosis not present

## 2023-10-29 DIAGNOSIS — Z419 Encounter for procedure for purposes other than remedying health state, unspecified: Secondary | ICD-10-CM | POA: Diagnosis not present

## 2023-11-29 DIAGNOSIS — Z419 Encounter for procedure for purposes other than remedying health state, unspecified: Secondary | ICD-10-CM | POA: Diagnosis not present

## 2023-12-15 DIAGNOSIS — F411 Generalized anxiety disorder: Secondary | ICD-10-CM | POA: Diagnosis not present

## 2023-12-27 DIAGNOSIS — Z419 Encounter for procedure for purposes other than remedying health state, unspecified: Secondary | ICD-10-CM | POA: Diagnosis not present

## 2024-01-13 DIAGNOSIS — M545 Low back pain, unspecified: Secondary | ICD-10-CM | POA: Diagnosis not present

## 2024-01-26 DIAGNOSIS — Z6841 Body Mass Index (BMI) 40.0 and over, adult: Secondary | ICD-10-CM | POA: Diagnosis not present

## 2024-01-26 DIAGNOSIS — E559 Vitamin D deficiency, unspecified: Secondary | ICD-10-CM | POA: Diagnosis not present

## 2024-01-26 DIAGNOSIS — M545 Low back pain, unspecified: Secondary | ICD-10-CM | POA: Diagnosis not present

## 2024-01-26 DIAGNOSIS — E66813 Obesity, class 3: Secondary | ICD-10-CM | POA: Diagnosis not present

## 2024-01-26 DIAGNOSIS — R110 Nausea: Secondary | ICD-10-CM | POA: Diagnosis not present

## 2024-01-26 DIAGNOSIS — G8929 Other chronic pain: Secondary | ICD-10-CM | POA: Diagnosis not present

## 2024-01-26 DIAGNOSIS — L732 Hidradenitis suppurativa: Secondary | ICD-10-CM | POA: Diagnosis not present

## 2024-01-27 DIAGNOSIS — M545 Low back pain, unspecified: Secondary | ICD-10-CM | POA: Diagnosis not present

## 2024-02-06 DIAGNOSIS — M5441 Lumbago with sciatica, right side: Secondary | ICD-10-CM | POA: Diagnosis not present

## 2024-02-06 DIAGNOSIS — M546 Pain in thoracic spine: Secondary | ICD-10-CM | POA: Diagnosis not present

## 2024-02-06 DIAGNOSIS — R531 Weakness: Secondary | ICD-10-CM | POA: Diagnosis not present

## 2024-02-06 DIAGNOSIS — R252 Cramp and spasm: Secondary | ICD-10-CM | POA: Diagnosis not present

## 2024-02-07 DIAGNOSIS — Z419 Encounter for procedure for purposes other than remedying health state, unspecified: Secondary | ICD-10-CM | POA: Diagnosis not present

## 2024-02-18 DIAGNOSIS — M546 Pain in thoracic spine: Secondary | ICD-10-CM | POA: Diagnosis not present

## 2024-02-18 DIAGNOSIS — M5441 Lumbago with sciatica, right side: Secondary | ICD-10-CM | POA: Diagnosis not present

## 2024-02-18 DIAGNOSIS — R252 Cramp and spasm: Secondary | ICD-10-CM | POA: Diagnosis not present

## 2024-02-18 DIAGNOSIS — R531 Weakness: Secondary | ICD-10-CM | POA: Diagnosis not present

## 2024-02-25 DIAGNOSIS — J342 Deviated nasal septum: Secondary | ICD-10-CM | POA: Diagnosis not present

## 2024-03-08 DIAGNOSIS — Z419 Encounter for procedure for purposes other than remedying health state, unspecified: Secondary | ICD-10-CM | POA: Diagnosis not present

## 2024-03-09 DIAGNOSIS — R0981 Nasal congestion: Secondary | ICD-10-CM | POA: Diagnosis not present

## 2024-03-09 DIAGNOSIS — Z9109 Other allergy status, other than to drugs and biological substances: Secondary | ICD-10-CM | POA: Diagnosis not present

## 2024-03-09 DIAGNOSIS — J3489 Other specified disorders of nose and nasal sinuses: Secondary | ICD-10-CM | POA: Diagnosis not present

## 2024-04-08 DIAGNOSIS — Z419 Encounter for procedure for purposes other than remedying health state, unspecified: Secondary | ICD-10-CM | POA: Diagnosis not present

## 2024-04-19 DIAGNOSIS — L732 Hidradenitis suppurativa: Secondary | ICD-10-CM | POA: Diagnosis not present

## 2024-04-27 DIAGNOSIS — I728 Aneurysm of other specified arteries: Secondary | ICD-10-CM | POA: Diagnosis not present

## 2024-05-08 DIAGNOSIS — Z419 Encounter for procedure for purposes other than remedying health state, unspecified: Secondary | ICD-10-CM | POA: Diagnosis not present

## 2024-05-31 DIAGNOSIS — I728 Aneurysm of other specified arteries: Secondary | ICD-10-CM | POA: Diagnosis not present

## 2024-06-08 DIAGNOSIS — Z419 Encounter for procedure for purposes other than remedying health state, unspecified: Secondary | ICD-10-CM | POA: Diagnosis not present

## 2024-07-09 DIAGNOSIS — Z419 Encounter for procedure for purposes other than remedying health state, unspecified: Secondary | ICD-10-CM | POA: Diagnosis not present

## 2024-08-23 DIAGNOSIS — L03031 Cellulitis of right toe: Secondary | ICD-10-CM | POA: Diagnosis not present
